# Patient Record
Sex: Female | Born: 1960 | Race: White | Hispanic: No | Smoking: Never smoker
Health system: Southern US, Community
[De-identification: ages and names within clinical notes are randomized; demographics above are authoritative.]

## PROBLEM LIST (undated history)

## (undated) DIAGNOSIS — F329 Major depressive disorder, single episode, unspecified: Secondary | ICD-10-CM

## (undated) DIAGNOSIS — E785 Hyperlipidemia, unspecified: Secondary | ICD-10-CM

## (undated) DIAGNOSIS — F32A Depression, unspecified: Secondary | ICD-10-CM

## (undated) DIAGNOSIS — M1990 Unspecified osteoarthritis, unspecified site: Secondary | ICD-10-CM

## (undated) DIAGNOSIS — I10 Essential (primary) hypertension: Secondary | ICD-10-CM

## (undated) DIAGNOSIS — M5126 Other intervertebral disc displacement, lumbar region: Secondary | ICD-10-CM

## (undated) HISTORY — PX: SKULL FRACTURE ELEVATION: SHX781

## (undated) HISTORY — PX: APPENDECTOMY: SHX54

## (undated) HISTORY — DX: Unspecified osteoarthritis, unspecified site: M19.90

## (undated) HISTORY — PX: SHOULDER SURGERY: SHX246

## (undated) HISTORY — PX: ANTERIOR CRUCIATE LIGAMENT REPAIR: SHX115

## (undated) HISTORY — DX: Major depressive disorder, single episode, unspecified: F32.9

## (undated) HISTORY — DX: Hyperlipidemia, unspecified: E78.5

## (undated) HISTORY — PX: ELBOW SURGERY: SHX618

## (undated) HISTORY — DX: Essential (primary) hypertension: I10

## (undated) HISTORY — PX: OTHER SURGICAL HISTORY: SHX169

## (undated) HISTORY — DX: Depression, unspecified: F32.A

## (undated) HISTORY — DX: Other intervertebral disc displacement, lumbar region: M51.26

---

## 2011-01-18 ENCOUNTER — Encounter (INDEPENDENT_AMBULATORY_CARE_PROVIDER_SITE_OTHER): Payer: Self-pay | Admitting: *Deleted

## 2011-01-28 NOTE — Letter (Signed)
Summary: Pre Visit Letter Revised  [facility]  [address] [place], [place] [address]   Phone: [phone]  Fax: [phone]        01/18/2011 MRN: [id number] Casey Drake [address] [place], [place]  [address]             Procedure Date:  03-01-11           Direct Colon---Dr. Russella Dar   Welcome to the Gastroenterology Division at [facility].    You are scheduled to see a nurse for your pre-procedure visit on 02-15-11 at 9:00a.m. on the 3rd floor at [facility], [address]  We ask that you try to arrive at our office 15 minutes prior to your appointment time to allow for check-in.  Please take a minute to review the attached form.  If you answer "Yes" to one or more of the questions on the first page, we ask that you call the person listed at your earliest opportunity.  If you answer "No" to all of the questions, please complete the rest of the form and bring it to your appointment.    Your nurse visit will consist of discussing your medical and surgical history, your immediate family medical history, and your medications.   If you are unable to list all of your medications on the form, please bring the medication bottles to your appointment and we will list them.  We will need to be aware of both prescribed and over the counter drugs.  We will need to know exact dosage information as well.    Please be prepared to read and sign documents such as consent forms, a financial agreement, and acknowledgement forms.  If necessary, and with your consent, a friend or relative is welcome to sit-in on the nurse visit with you.  Please bring your insurance card so that we may make a copy of it.  If your insurance requires a referral to see a specialist, please bring your referral form from your primary care physician.  No co-pay is required for this nurse visit.     If you cannot keep your appointment, please call [phone] to cancel or reschedule prior to  your appointment date.  This allows [place] the opportunity to schedule an appointment for another patient in need of care.    Thank you for choosing [facility] for your medical needs.  We appreciate the opportunity to care for you.  Please visit [place] at our website  to learn more about our practice.  Sincerely, The Gastroenterology Division

## 2011-03-01 ENCOUNTER — Other Ambulatory Visit: Payer: Self-pay | Admitting: Gastroenterology

## 2011-03-16 ENCOUNTER — Ambulatory Visit (AMBULATORY_SURGERY_CENTER): Payer: BC Managed Care – PPO

## 2011-03-16 VITALS — Ht 65.0 in | Wt 150.7 lb

## 2011-03-16 DIAGNOSIS — Z1211 Encounter for screening for malignant neoplasm of colon: Secondary | ICD-10-CM

## 2011-03-17 ENCOUNTER — Other Ambulatory Visit: Payer: Self-pay | Admitting: Gastroenterology

## 2011-03-17 ENCOUNTER — Encounter: Payer: Self-pay | Admitting: Gastroenterology

## 2011-03-17 MED ORDER — PEG-KCL-NACL-NASULF-NA ASC-C 100 G PO SOLR: ORAL | Status: DC

## 2011-03-17 NOTE — Telephone Encounter (Signed)
Notified patient that her moviprep was sent to [facility] pharmacy today,but if not there when she goes to call us back. She understands.

## 2011-03-22 ENCOUNTER — Encounter: Payer: Self-pay | Admitting: Gastroenterology

## 2011-03-23 ENCOUNTER — Ambulatory Visit (AMBULATORY_SURGERY_CENTER): Payer: BC Managed Care – PPO | Admitting: Gastroenterology

## 2011-03-23 ENCOUNTER — Encounter: Payer: Self-pay | Admitting: Gastroenterology

## 2011-03-23 VITALS — BP 124/80 | HR 72 | Temp 99.1°F | Resp 16 | Ht 65.0 in | Wt 144.5 lb

## 2011-03-23 DIAGNOSIS — K621 Rectal polyp: Secondary | ICD-10-CM

## 2011-03-23 DIAGNOSIS — Z1211 Encounter for screening for malignant neoplasm of colon: Secondary | ICD-10-CM

## 2011-03-23 DIAGNOSIS — D126 Benign neoplasm of colon, unspecified: Secondary | ICD-10-CM

## 2011-03-23 DIAGNOSIS — K5730 Diverticulosis of large intestine without perforation or abscess without bleeding: Secondary | ICD-10-CM

## 2011-03-23 DIAGNOSIS — K620 Anal polyp: Secondary | ICD-10-CM

## 2011-03-23 MED ORDER — SODIUM CHLORIDE 0.9 % IV SOLN: 500.0000 mL | INTRAVENOUS | Status: DC

## 2011-03-23 NOTE — Patient Instructions (Addendum)
Please refer to blue and green discharge instruction sheets. Polyps removed today. Diverticulosis found so eat a high fiber diet with liberal fluid intake as a permanent lifestyle, just not today. Today follow diet listed under light meal recommendations. Repeat colonoscopy in 3-5 years.

## 2011-03-24 ENCOUNTER — Telehealth: Payer: Self-pay

## 2011-03-24 ENCOUNTER — Ambulatory Visit: Payer: BC Managed Care – PPO | Attending: Physical Medicine and Rehabilitation | Admitting: Physical Therapy

## 2011-03-24 DIAGNOSIS — R5381 Other malaise: Secondary | ICD-10-CM | POA: Insufficient documentation

## 2011-03-24 DIAGNOSIS — IMO0001 Reserved for inherently not codable concepts without codable children: Secondary | ICD-10-CM | POA: Insufficient documentation

## 2011-03-24 DIAGNOSIS — M545 Low back pain, unspecified: Secondary | ICD-10-CM | POA: Insufficient documentation

## 2011-03-24 DIAGNOSIS — M2560 Stiffness of unspecified joint, not elsewhere classified: Secondary | ICD-10-CM | POA: Insufficient documentation

## 2011-03-24 NOTE — Telephone Encounter (Signed)

## 2011-03-31 ENCOUNTER — Encounter: Payer: Self-pay | Admitting: Gastroenterology

## 2013-01-18 ENCOUNTER — Telehealth: Payer: Self-pay | Admitting: Nurse Practitioner

## 2013-01-18 ENCOUNTER — Other Ambulatory Visit: Payer: Self-pay | Admitting: Nurse Practitioner

## 2013-01-18 MED ORDER — NORETHINDRONE-ETH ESTRADIOL 1-5 MG-MCG PO TABS: 1.0000 | ORAL_TABLET | Freq: Every day | ORAL | Status: DC

## 2013-01-18 NOTE — Telephone Encounter (Signed)
Please advise 

## 2013-01-18 NOTE — Telephone Encounter (Signed)
Birthcontrol  jinteli  [facility] [place] needs by  weekend

## 2013-01-25 ENCOUNTER — Telehealth: Payer: Self-pay | Admitting: Nurse Practitioner

## 2013-01-25 ENCOUNTER — Encounter: Payer: Self-pay | Admitting: General Practice

## 2013-01-25 ENCOUNTER — Ambulatory Visit (INDEPENDENT_AMBULATORY_CARE_PROVIDER_SITE_OTHER): Payer: BC Managed Care – PPO | Admitting: General Practice

## 2013-01-25 VITALS — BP 138/85 | HR 75 | Temp 97.2°F | Ht 65.0 in | Wt 163.0 lb

## 2013-01-25 DIAGNOSIS — J069 Acute upper respiratory infection, unspecified: Secondary | ICD-10-CM

## 2013-01-25 DIAGNOSIS — J329 Chronic sinusitis, unspecified: Secondary | ICD-10-CM

## 2013-01-25 MED ORDER — AMOXICILLIN 875 MG PO TABS: 875.0000 mg | ORAL_TABLET | Freq: Two times a day (BID) | ORAL | Status: AC

## 2013-01-25 NOTE — Patient Instructions (Signed)
Sinusitis Sinusitis is redness, soreness, and swelling (inflammation) of the paranasal sinuses. Paranasal sinuses are air pockets within the bones of your face (beneath the eyes, the middle of the forehead, or above the eyes). In healthy paranasal sinuses, mucus is able to drain out, and air is able to circulate through them by way of your nose. However, when your paranasal sinuses are inflamed, mucus and air can become trapped. This can allow bacteria and other germs to grow and cause infection. Sinusitis can develop quickly and last only a short time (acute) or continue over a long period (chronic). Sinusitis that lasts for more than 12 weeks is considered chronic.  CAUSES  Causes of sinusitis include:  Allergies.  Structural abnormalities, such as displacement of the cartilage that separates your nostrils (deviated septum), which can decrease the air flow through your nose and sinuses and affect sinus drainage.  Functional abnormalities, such as when the small hairs (cilia) that line your sinuses and help remove mucus do not work properly or are not present. SYMPTOMS  Symptoms of acute and chronic sinusitis are the same. The primary symptoms are pain and pressure around the affected sinuses. Other symptoms include:  Upper toothache.  Earache.  Headache.  Bad breath.  Decreased sense of smell and taste.  A cough, which worsens when you are lying flat.  Fatigue.  Fever.  Thick drainage from your nose, which often is green and may contain pus (purulent).  Swelling and warmth over the affected sinuses. DIAGNOSIS  Your caregiver will perform a physical exam. During the exam, your caregiver may:  Look in your nose for signs of abnormal growths in your nostrils (nasal polyps).  Tap over the affected sinus to check for signs of infection.  View the inside of your sinuses (endoscopy) with a special imaging device with a light attached (endoscope), which is inserted into your  sinuses. If your caregiver suspects that you have chronic sinusitis, one or more of the following tests may be recommended:  Allergy tests.  Nasal culture A sample of mucus is taken from your nose and sent to a lab and screened for bacteria.  Nasal cytology A sample of mucus is taken from your nose and examined by your caregiver to determine if your sinusitis is related to an allergy. TREATMENT  Most cases of acute sinusitis are related to a viral infection and will resolve on their own within 10 days. Sometimes medicines are prescribed to help relieve symptoms (pain medicine, decongestants, nasal steroid sprays, or saline sprays).  However, for sinusitis related to a bacterial infection, your caregiver will prescribe antibiotic medicines. These are medicines that will help kill the bacteria causing the infection.  Rarely, sinusitis is caused by a fungal infection. In theses cases, your caregiver will prescribe antifungal medicine. For some cases of chronic sinusitis, surgery is needed. Generally, these are cases in which sinusitis recurs more than 3 times per year, despite other treatments. HOME CARE INSTRUCTIONS   Drink plenty of water. Water helps thin the mucus so your sinuses can drain more easily.  Use a humidifier.  Inhale steam 3 to 4 times a day (for example, sit in the bathroom with the shower running).  Apply a warm, moist washcloth to your face 3 to 4 times a day, or as directed by your caregiver.  Use saline nasal sprays to help moisten and clean your sinuses.  Take over-the-counter or prescription medicines for pain, discomfort, or fever only as directed by your caregiver. SEEK IMMEDIATE MEDICAL   or as directed by your caregiver.   Use saline nasal sprays to help moisten and clean your sinuses.   Take over-the-counter or prescription medicines for pain, discomfort, or fever only as directed by your caregiver.  SEEK IMMEDIATE MEDICAL CARE IF:   You have increasing pain or severe headaches.   You have nausea, vomiting, or drowsiness.   You have swelling around your face.   You have vision problems.   You have a stiff neck.   You have difficulty breathing.  MAKE SURE YOU:    Understand these  instructions.   Will watch your condition.   Will get help right away if you are not doing well or get worse.  Document Released: 10/18/2005 Document Revised: 01/10/2012 Document Reviewed: 11/02/2011  ExitCare Patient Information 2013 [employer].    Upper Respiratory Infection, Adult  An upper respiratory infection (URI) is also sometimes known as the common cold. The upper respiratory tract includes the nose, sinuses, throat, trachea, and bronchi. Bronchi are the airways leading to the lungs. Most people improve within 1 week, but symptoms can last up to 2 weeks. A residual cough may last even longer.   CAUSES  Many different viruses can infect the tissues lining the upper respiratory tract. The tissues become irritated and inflamed and often become very moist. Mucus production is also common. A cold is contagious. You can easily spread the virus to others by oral contact. This includes kissing, sharing a glass, coughing, or sneezing. Touching your mouth or nose and then touching a surface, which is then touched by another person, can also spread the virus.  SYMPTOMS   Symptoms typically develop 1 to 3 days after you come in contact with a cold virus. Symptoms vary from person to person. They may include:   Runny nose.   Sneezing.   Nasal congestion.   Sinus irritation.   Sore throat.   Loss of voice (laryngitis).   Cough.   Fatigue.   Muscle aches.   Loss of appetite.   Headache.   Low-grade fever.  DIAGNOSIS   You might diagnose your own cold based on familiar symptoms, since most people get a cold 2 to 3 times a year. Your caregiver can confirm this based on your exam. Most importantly, your caregiver can check that your symptoms are not due to another disease such as strep throat, sinusitis, pneumonia, asthma, or epiglottitis. Blood tests, throat tests, and X-rays are not necessary to diagnose a common cold, but they may sometimes be helpful in excluding other more serious diseases. Your  caregiver will decide if any further tests are required.  RISKS AND COMPLICATIONS   You may be at risk for a more severe case of the common cold if you smoke cigarettes, have chronic heart disease (such as heart failure) or lung disease (such as asthma), or if you have a weakened immune system. The very young and very old are also at risk for more serious infections. Bacterial sinusitis, middle ear infections, and bacterial pneumonia can complicate the common cold. The common cold can worsen asthma and chronic obstructive pulmonary disease (COPD). Sometimes, these complications can require emergency medical care and may be life-threatening.  PREVENTION   The best way to protect against getting a cold is to practice good hygiene. Avoid oral or hand contact with people with cold symptoms. Wash your hands often if contact occurs. There is no clear evidence that vitamin C, vitamin E, echinacea, or exercise reduces the chance of developing a   cold. However, it is always recommended to get plenty of rest and practice good nutrition.  TREATMENT   Treatment is directed at relieving symptoms. There is no cure. Antibiotics are not effective, because the infection is caused by a virus, not by bacteria. Treatment may include:   Increased fluid intake. Sports drinks offer valuable electrolytes, sugars, and fluids.   Breathing heated mist or steam (vaporizer or shower).   Eating chicken soup or other clear broths, and maintaining good nutrition.   Getting plenty of rest.   Using gargles or lozenges for comfort.   Controlling fevers with ibuprofen or acetaminophen as directed by your caregiver.   Increasing usage of your inhaler if you have asthma.  Zinc gel and zinc lozenges, taken in the first 24 hours of the common cold, can shorten the duration and lessen the severity of symptoms. Pain medicines may help with fever, muscle aches, and throat pain. A variety of non-prescription medicines are available to treat congestion  and runny nose. Your caregiver can make recommendations and may suggest nasal or lung inhalers for other symptoms.   HOME CARE INSTRUCTIONS    Only take over-the-counter or prescription medicines for pain, discomfort, or fever as directed by your caregiver.   Use a warm mist humidifier or inhale steam from a shower to increase air moisture. This may keep secretions moist and make it easier to breathe.   Drink enough water and fluids to keep your urine clear or pale yellow.   Rest as needed.   Return to work when your temperature has returned to normal or as your caregiver advises. You may need to stay home longer to avoid infecting others. You can also use a face mask and careful hand washing to prevent spread of the virus.  SEEK MEDICAL CARE IF:    After the first few days, you feel you are getting worse rather than better.   You need your caregiver's advice about medicines to control symptoms.   You develop chills, worsening shortness of breath, or brown or red sputum. These may be signs of pneumonia.   You develop yellow or brown nasal discharge or pain in the face, especially when you bend forward. These may be signs of sinusitis.   You develop a fever, swollen neck glands, pain with swallowing, or white areas in the back of your throat. These may be signs of strep throat.  SEEK IMMEDIATE MEDICAL CARE IF:    You have a fever.   You develop severe or persistent headache, ear pain, sinus pain, or chest pain.   You develop wheezing, a prolonged cough, cough up blood, or have a change in your usual mucus (if you have chronic lung disease).   You develop sore muscles or a stiff neck.  Document Released: 04/13/2001 Document Revised: 01/10/2012 Document Reviewed: 02/19/2011  ExitCare Patient Information 2013 [employer].

## 2013-01-25 NOTE — Progress Notes (Signed)
  Subjective:    Patient ID: Casey Drake, female    DOB: 09/09/1961, 52 y.o.   MRN: [id number]  HPI Presents today with nasal congestion and cough for past 3-4 days. Rapidly worsened overnight. OTC mucinex and  nyquil taken without relief. Reports feeling like she had a fever, but temperature not checked. Reports symptoms worse at night.    Review of Systems  Constitutional: Positive for fever and chills.  HENT: Positive for sore throat and sinus pressure.   Eyes: Negative for pain, discharge and itching.  Respiratory: Positive for cough. Negative for chest tightness and shortness of breath.   Cardiovascular: Negative for chest pain.  Neurological: Negative for dizziness and headaches.  Psychiatric/Behavioral: Negative.        Objective:   Physical Exam  Constitutional: She is oriented to person, place, and time. She appears well-developed and well-nourished.  HENT:  Head: Normocephalic and atraumatic.  Right Ear: External ear normal.  Left Ear: External ear normal.  Nose: Right sinus exhibits maxillary sinus tenderness and frontal sinus tenderness. Left sinus exhibits maxillary sinus tenderness and frontal sinus tenderness.  Neck: Normal range of motion.  Cardiovascular: Normal rate, regular rhythm and normal heart sounds.   No murmur heard. Pulmonary/Chest: Effort normal and breath sounds normal. No respiratory distress. She exhibits no tenderness.  Neurological: She is alert and oriented to person, place, and time.  Skin: Skin is warm and dry. No rash noted.  Psychiatric: She has a normal mood and affect.          Assessment & Plan:  Complete full antibiotic course even if feeling better Increase fluid intake Proper handwashing RTO if not feeling better in 3 days and doesn't completely resolve  Patient verbalized understanding   Raymon Mutton, FNP-C

## 2013-01-25 NOTE — Telephone Encounter (Signed)
appt made for today 

## 2013-01-25 NOTE — Telephone Encounter (Signed)
Wtbs. Bad cold, sinus infection, coughing up yellow stuff, lost her voice.

## 2013-02-03 ENCOUNTER — Telehealth: Payer: Self-pay | Admitting: Nurse Practitioner

## 2013-02-03 NOTE — Telephone Encounter (Signed)
The return to work day is February 05, 2013, because she is still sick. Thank you

## 2013-02-05 NOTE — Telephone Encounter (Signed)
done

## 2013-03-28 ENCOUNTER — Other Ambulatory Visit: Payer: Self-pay | Admitting: Nurse Practitioner

## 2013-03-30 NOTE — Telephone Encounter (Signed)
Don't see last refill on epic or in chart? [facility]

## 2013-03-30 NOTE — Telephone Encounter (Signed)
Please call in rx for xanax 

## 2013-04-02 ENCOUNTER — Telehealth: Payer: Self-pay | Admitting: Nurse Practitioner

## 2013-04-02 NOTE — Telephone Encounter (Signed)
rx called in

## 2013-04-02 NOTE — Telephone Encounter (Signed)
No appts available for 6/3.  Appt scheduled for 04/04/13.  Pt will check back to see if there is a cancellation for tomorrow.  Also offered appt with another provider.  She will call to reschedule if she decides she can't come 04/04/13.

## 2013-04-04 ENCOUNTER — Encounter: Payer: Self-pay | Admitting: Nurse Practitioner

## 2013-04-04 ENCOUNTER — Ambulatory Visit (INDEPENDENT_AMBULATORY_CARE_PROVIDER_SITE_OTHER): Payer: BC Managed Care – PPO | Admitting: Nurse Practitioner

## 2013-04-04 VITALS — BP 137/85 | HR 68 | Temp 97.2°F | Ht 64.0 in | Wt 159.0 lb

## 2013-04-04 DIAGNOSIS — F329 Major depressive disorder, single episode, unspecified: Secondary | ICD-10-CM | POA: Insufficient documentation

## 2013-04-04 DIAGNOSIS — G8929 Other chronic pain: Secondary | ICD-10-CM

## 2013-04-04 DIAGNOSIS — M545 Low back pain, unspecified: Secondary | ICD-10-CM

## 2013-04-04 DIAGNOSIS — F32A Depression, unspecified: Secondary | ICD-10-CM | POA: Insufficient documentation

## 2013-04-04 DIAGNOSIS — F411 Generalized anxiety disorder: Secondary | ICD-10-CM | POA: Insufficient documentation

## 2013-04-04 DIAGNOSIS — E785 Hyperlipidemia, unspecified: Secondary | ICD-10-CM | POA: Insufficient documentation

## 2013-04-04 MED ORDER — METHYLPREDNISOLONE ACETATE 80 MG/ML IJ SUSP
80.0000 mg | Freq: Once | INTRAMUSCULAR | Status: AC
  Administered 2013-04-04: 80 mg via INTRAMUSCULAR

## 2013-04-04 NOTE — Patient Instructions (Signed)
Back Pain, Adult  Low back pain is very common. About 1 in 5 people have back pain. The cause of low back pain is rarely dangerous. The pain often gets better over time. About half of people with a sudden onset of back pain feel better in just 2 weeks. About 8 in 10 people feel better by 6 weeks.   CAUSES  Some common causes of back pain include:  · Strain of the muscles or ligaments supporting the spine.  · Wear and tear (degeneration) of the spinal discs.  · Arthritis.  · Direct injury to the back.  DIAGNOSIS  Most of the time, the direct cause of low back pain is not known. However, back pain can be treated effectively even when the exact cause of the pain is unknown. Answering your caregiver's questions about your overall health and symptoms is one of the most accurate ways to make sure the cause of your pain is not dangerous. If your caregiver needs more information, he or she may order lab work or imaging tests (X-rays or MRIs). However, even if imaging tests show changes in your back, this usually does not require surgery.  HOME CARE INSTRUCTIONS  For many people, back pain returns. Since low back pain is rarely dangerous, it is often a condition that people can learn to manage on their own.   · Remain active. It is stressful on the back to sit or stand in one place. Do not sit, drive, or stand in one place for more than 30 minutes at a time. Take short walks on level surfaces as soon as pain allows. Try to increase the length of time you walk each day.  · Do not stay in bed. Resting more than 1 or 2 days can delay your recovery.  · Do not avoid exercise or work. Your body is made to move. It is not dangerous to be active, even though your back may hurt. Your back will likely heal faster if you return to being active before your pain is gone.  · Pay attention to your body when you  bend and lift. Many people have less discomfort when lifting if they bend their knees, keep the load close to their bodies, and  avoid twisting. Often, the most comfortable positions are those that put less stress on your recovering back.  · Find a comfortable position to sleep. Use a firm mattress and lie on your side with your knees slightly bent. If you lie on your back, put a pillow under your knees.  · Only take over-the-counter or prescription medicines as directed by your caregiver. Over-the-counter medicines to reduce pain and inflammation are often the most helpful. Your caregiver may prescribe muscle relaxant drugs. These medicines help dull your pain so you can more quickly return to your normal activities and healthy exercise.  · Put ice on the injured area.  · Put ice in a plastic bag.  · Place a towel between your skin and the bag.  · Leave the ice on for 15-20 minutes, 3-4 times a day for the first 2 to 3 days. After that, ice and heat may be alternated to reduce pain and spasms.  · Ask your caregiver about trying back exercises and gentle massage. This may be of some benefit.  · Avoid feeling anxious or stressed. Stress increases muscle tension and can worsen back pain. It is important to recognize when you are anxious or stressed and learn ways to manage it. Exercise is a great option.  SEEK MEDICAL CARE IF:  · You have pain that is not relieved with rest or   medicine.  · You have pain that does not improve in 1 week.  · You have new symptoms.  · You are generally not feeling well.  SEEK IMMEDIATE MEDICAL CARE IF:   · You have pain that radiates from your back into your legs.  · You develop new bowel or bladder control problems.  · You have unusual weakness or numbness in your arms or legs.  · You develop nausea or vomiting.  · You develop abdominal pain.  · You feel faint.  Document Released: 10/18/2005 Document Revised: 04/18/2012 Document Reviewed: 03/08/2011  ExitCare® Patient Information ©2014 [employer].

## 2013-04-04 NOTE — Progress Notes (Signed)
  Subjective:    Patient ID: Casey Drake, female    DOB: 24-Feb-1961, 52 y.o.   MRN: [id number]  Back Pain This is a recurrent problem. The current episode started more than 1 year ago. The problem occurs 2 to 4 times per day. The problem has been waxing and waning since onset. The pain is present in the lumbar spine. The quality of the pain is described as stabbing (pulling pain). The pain radiates to the right foot. The pain is at a severity of 7/10. The pain is moderate. The pain is worse during the day. The symptoms are aggravated by bending, position, standing, sitting and twisting. Stiffness is present in the morning. Associated symptoms include leg pain (right). Pertinent negatives include no numbness, tingling or weakness. Treatments tried: pain meds. The treatment provided moderate relief.      Review of Systems  Musculoskeletal: Positive for back pain.  Neurological: Negative for tingling, weakness and numbness.  All other systems reviewed and are negative.       Objective:   Physical Exam  Constitutional: She appears well-developed and well-nourished.  Cardiovascular: Normal rate, normal heart sounds and intact distal pulses.   Musculoskeletal:  Decreased ROM due to pain on flexion and extension Sensation distally intact  Neurological: She has normal reflexes.  Skin: Skin is warm.  Psychiatric: She has a normal mood and affect. Her behavior is normal. Judgment and thought content normal.     BP 137/85  Pulse 68  Temp(Src) 97.2 F (36.2 C) (Oral)  Ht 5\' 4"  (1.626 m)  Wt 159 lb (72.122 kg)  BMI 27.28 kg/m2      Assessment & Plan:   1. Chronic low back pain   Depomedrol 80 mg Im now Moist heat Rest Follow up prn  [name] Daphine Deutscher, FNP

## 2013-05-08 ENCOUNTER — Other Ambulatory Visit: Payer: Self-pay | Admitting: Nurse Practitioner

## 2013-05-10 ENCOUNTER — Telehealth: Payer: Self-pay | Admitting: Nurse Practitioner

## 2013-05-10 NOTE — Telephone Encounter (Signed)
Last seen 04/04/13 

## 2013-05-11 NOTE — Telephone Encounter (Signed)
All done on 05/08/13

## 2013-08-06 ENCOUNTER — Ambulatory Visit (INDEPENDENT_AMBULATORY_CARE_PROVIDER_SITE_OTHER): Payer: BC Managed Care – PPO | Admitting: Nurse Practitioner

## 2013-08-06 ENCOUNTER — Encounter: Payer: Self-pay | Admitting: Nurse Practitioner

## 2013-08-06 ENCOUNTER — Telehealth: Payer: Self-pay | Admitting: Nurse Practitioner

## 2013-08-06 VITALS — BP 136/78 | HR 73 | Temp 97.9°F | Ht 64.0 in | Wt 171.0 lb

## 2013-08-06 DIAGNOSIS — Z23 Encounter for immunization: Secondary | ICD-10-CM

## 2013-08-06 DIAGNOSIS — M25559 Pain in unspecified hip: Secondary | ICD-10-CM

## 2013-08-06 DIAGNOSIS — M25551 Pain in right hip: Secondary | ICD-10-CM

## 2013-08-06 MED ORDER — CITALOPRAM HYDROBROMIDE 20 MG PO TABS: 20.0000 mg | ORAL_TABLET | Freq: Every day | ORAL | Status: DC

## 2013-08-06 MED ORDER — TRAZODONE HCL 50 MG PO TABS: 50.0000 mg | ORAL_TABLET | Freq: Every day | ORAL | Status: DC

## 2013-08-06 MED ORDER — METHYLPREDNISOLONE ACETATE 80 MG/ML IJ SUSP: 80.0000 mg | Freq: Once | INTRAMUSCULAR | Status: DC

## 2013-08-06 MED ORDER — METOPROLOL SUCCINATE ER 25 MG PO TB24: 25.0000 mg | ORAL_TABLET | Freq: Every day | ORAL | Status: DC

## 2013-08-06 MED ORDER — METHYLPREDNISOLONE ACETATE 80 MG/ML IJ SUSP
80.0000 mg | Freq: Once | INTRAMUSCULAR | Status: AC
  Administered 2013-08-06: 80 mg via INTRAMUSCULAR

## 2013-08-06 MED ORDER — ALPRAZOLAM 1 MG PO TABS: 1.0000 mg | ORAL_TABLET | Freq: Every evening | ORAL | Status: DC | PRN

## 2013-08-06 NOTE — Telephone Encounter (Signed)
Patient came in and was worked into the schedule.

## 2013-08-06 NOTE — Patient Instructions (Signed)

## 2013-08-06 NOTE — Telephone Encounter (Signed)
Spoke with patient but she lost cell phone coverage. Will attempt to call again later today.

## 2013-08-06 NOTE — Progress Notes (Signed)
  Subjective:    Patient ID: Casey Drake, female    DOB: 10/24/1961, 52 y.o.   MRN: [id number]  HPI Patient in today C/O right hip pain- Flares up a couple of times a year- she usually gets a steroid shot and that helps for several months- Sees Dr. Ethelene Hal for steroid injections into back- Current pain is in right leg and radiates down front of leg to ankle- sitting increases pain- walking or standing helps pain.    Review of Systems  All other systems reviewed and are negative.       Objective:   Physical Exam  Constitutional: She is oriented to person, place, and time. She appears well-developed and well-nourished.  Cardiovascular: Normal rate, regular rhythm and normal heart sounds.   Pulmonary/Chest: Effort normal and breath sounds normal.  Musculoskeletal: Normal range of motion.  Negative SLR bil Pain Rt hip with full extension and abduction  Neurological: She is alert and oriented to person, place, and time. She has normal reflexes.    BP 136/78  Pulse 73  Temp(Src) 97.9 F (36.6 C) (Oral)  Ht 5\' 4"  (1.626 m)  Wt 171 lb (77.565 kg)  BMI 29.34 kg/m2       Assessment & Plan:   1. Right hip pain    Meds ordered this encounter  Medications  . ALPRAZolam (XANAX) 1 MG tablet    Sig: Take 1 tablet (1 mg total) by mouth at bedtime as needed for sleep.    Dispense:  30 tablet    Refill:  1  . citalopram (CELEXA) 20 MG tablet    Sig: Take 1 tablet (20 mg total) by mouth daily.    Dispense:  90 tablet    Refill:  1  . metoprolol succinate (TOPROL-XL) 25 MG 24 hr tablet    Sig: Take 1 tablet (25 mg total) by mouth daily.    Dispense:  30 tablet    Refill:  5  . traZODone (DESYREL) 50 MG tablet    Sig: Take 1 tablet (50 mg total) by mouth at bedtime.    Dispense:  180 tablet    Refill:  1  . DISCONTD: methylPREDNISolone acetate (DEPO-MEDROL) injection 80 mg    Sig:   . methylPREDNISolone acetate (DEPO-MEDROL) injection 80 mg    Sig:    Warm compresses Rest   RTO prn  [name] Daphine Deutscher, FNP

## 2013-08-08 ENCOUNTER — Other Ambulatory Visit: Payer: Self-pay | Admitting: Nurse Practitioner

## 2013-08-09 NOTE — Telephone Encounter (Signed)
Last seen 08/06/13  MMM  If approved route to [facility] to [facility]

## 2013-11-14 ENCOUNTER — Telehealth: Payer: Self-pay | Admitting: Nurse Practitioner

## 2013-11-14 NOTE — Telephone Encounter (Signed)
Appt tomorrow with Ronnald Collum

## 2013-11-15 ENCOUNTER — Ambulatory Visit (INDEPENDENT_AMBULATORY_CARE_PROVIDER_SITE_OTHER): Payer: BC Managed Care – PPO | Admitting: Nurse Practitioner

## 2013-11-15 ENCOUNTER — Telehealth: Payer: Self-pay | Admitting: Nurse Practitioner

## 2013-11-15 ENCOUNTER — Encounter: Payer: Self-pay | Admitting: Nurse Practitioner

## 2013-11-15 VITALS — BP 135/85 | HR 64 | Temp 97.0°F | Ht 64.0 in | Wt 167.0 lb

## 2013-11-15 DIAGNOSIS — M25551 Pain in right hip: Secondary | ICD-10-CM | POA: Insufficient documentation

## 2013-11-15 DIAGNOSIS — M5431 Sciatica, right side: Secondary | ICD-10-CM

## 2013-11-15 DIAGNOSIS — M25559 Pain in unspecified hip: Secondary | ICD-10-CM

## 2013-11-15 DIAGNOSIS — M5430 Sciatica, unspecified side: Secondary | ICD-10-CM

## 2013-11-15 MED ORDER — KETOROLAC TROMETHAMINE 60 MG/2ML IM SOLN
60.0000 mg | Freq: Once | INTRAMUSCULAR | Status: AC
  Administered 2013-11-15: 60 mg via INTRAMUSCULAR

## 2013-11-15 NOTE — Patient Instructions (Signed)
Sciatica °Sciatica is pain, weakness, numbness, or tingling along the path of the sciatic nerve. The nerve starts in the lower back and runs down the back of each leg. The nerve controls the muscles in the lower leg and in the back of the knee, while also providing sensation to the back of the thigh, lower leg, and the sole of your foot. Sciatica is a symptom of another medical condition. For instance, nerve damage or certain conditions, such as a herniated disk or bone spur on the spine, pinch or put pressure on the sciatic nerve. This causes the pain, weakness, or other sensations normally associated with sciatica. Generally, sciatica only affects one side of the body. °CAUSES  °· Herniated or slipped disc. °· Degenerative disk disease. °· A pain disorder involving the narrow muscle in the buttocks (piriformis syndrome). °· Pelvic injury or fracture. °· Pregnancy. °· Tumor (rare). °SYMPTOMS  °Symptoms can vary from mild to very severe. The symptoms usually travel from the low back to the buttocks and down the back of the leg. Symptoms can include: °· Mild tingling or dull aches in the lower back, leg, or hip. °· Numbness in the back of the calf or sole of the foot. °· Burning sensations in the lower back, leg, or hip. °· Sharp pains in the lower back, leg, or hip. °· Leg weakness. °· Severe back pain inhibiting movement. °These symptoms may get worse with coughing, sneezing, laughing, or prolonged sitting or standing. Also, being overweight may worsen symptoms. °DIAGNOSIS  °Your caregiver will perform a physical exam to look for common symptoms of sciatica. He or she may ask you to do certain movements or activities that would trigger sciatic nerve pain. Other tests may be performed to find the cause of the sciatica. These may include: °· Blood tests. °· X-rays. °· Imaging tests, such as an MRI or CT scan. °TREATMENT  °Treatment is directed at the cause of the sciatic pain. Sometimes, treatment is not necessary  and the pain and discomfort goes away on its own. If treatment is needed, your caregiver may suggest: °· Over-the-counter medicines to relieve pain. °· Prescription medicines, such as anti-inflammatory medicine, muscle relaxants, or narcotics. °· Applying heat or ice to the painful area. °· Steroid injections to lessen pain, irritation, and inflammation around the nerve. °· Reducing activity during periods of pain. °· Exercising and stretching to strengthen your abdomen and improve flexibility of your spine. Your caregiver may suggest losing weight if the extra weight makes the back pain worse. °· Physical therapy. °· Surgery to eliminate what is pressing or pinching the nerve, such as a bone spur or part of a herniated disk. °HOME CARE INSTRUCTIONS  °· Only take over-the-counter or prescription medicines for pain or discomfort as directed by your caregiver. °· Apply ice to the affected area for 20 minutes, 3 4 times a day for the first 48 72 hours. Then try heat in the same way. °· Exercise, stretch, or perform your usual activities if these do not aggravate your pain. °· Attend physical therapy sessions as directed by your caregiver. °· Keep all follow-up appointments as directed by your caregiver. °· Do not wear high heels or shoes that do not provide proper support. °· Check your mattress to see if it is too soft. A firm mattress may lessen your pain and discomfort. °SEEK IMMEDIATE MEDICAL CARE IF:  °· You lose control of your bowel or bladder (incontinence). °· You have increasing weakness in the lower back,   pelvis, buttocks, or legs. °· You have redness or swelling of your back. °· You have a burning sensation when you urinate. °· You have pain that gets worse when you lie down or awakens you at night. °· Your pain is worse than you have experienced in the past. °· Your pain is lasting longer than 4 weeks. °· You are suddenly losing weight without reason. °MAKE SURE YOU: °· Understand these  instructions. °· Will watch your condition. °· Will get help right away if you are not doing well or get worse. °Document Released: 10/12/2001 Document Revised: 04/18/2012 Document Reviewed: 02/27/2012 °ExitCare® Patient Information ©2014 [employer]. ° °

## 2013-11-15 NOTE — Telephone Encounter (Signed)
Patient called to let us know correct dose of metoprolol

## 2013-11-15 NOTE — Progress Notes (Signed)
   Subjective:    Patient ID: Casey Drake, female    DOB: [birth year], 53 y.o.   MRN: [id number]  HPI Patient has history of intermittent sciatica- she has been having to sit for extended periods of time for work and that has aggravated more.    Review of Systems  Constitutional: Negative.   HENT: Negative.   Respiratory: Negative.   Cardiovascular: Negative.   Gastrointestinal: Negative.   All other systems reviewed and are negative.       Objective:   Physical Exam  Constitutional: She appears well-developed and well-nourished.  Cardiovascular: Normal rate, regular rhythm and normal heart sounds.   Pulmonary/Chest: Effort normal and breath sounds normal.  Musculoskeletal:  Pain right hip and buttocks- radiates down back of right leg Negative straight leg raises bil   BP 135/85  Pulse 64  Temp(Src) 97 F (36.1 C) (Oral)  Ht 5\' 4"  (1.626 m)  Wt 167 lb (75.751 kg)  BMI 28.65 kg/m2        Assessment & Plan:   1. Right hip pain   2. Right sided sciatica    Meds ordered this encounter  Medications  . ketorolac (TORADOL) injection 60 mg    Sig:    Moist heat/heating pad Stretching exercises Up and walk around at least every 2 hours  [name] Hassell Done, FNP

## 2013-12-24 ENCOUNTER — Telehealth: Payer: Self-pay | Admitting: Nurse Practitioner

## 2013-12-24 ENCOUNTER — Other Ambulatory Visit: Payer: Self-pay | Admitting: Family Medicine

## 2013-12-24 ENCOUNTER — Ambulatory Visit (INDEPENDENT_AMBULATORY_CARE_PROVIDER_SITE_OTHER): Payer: BC Managed Care – PPO | Admitting: Family Medicine

## 2013-12-24 ENCOUNTER — Encounter: Payer: Self-pay | Admitting: Family Medicine

## 2013-12-24 VITALS — BP 153/88 | HR 67 | Temp 97.1°F | Ht 64.0 in | Wt 167.0 lb

## 2013-12-24 DIAGNOSIS — R05 Cough: Secondary | ICD-10-CM

## 2013-12-24 DIAGNOSIS — J069 Acute upper respiratory infection, unspecified: Secondary | ICD-10-CM

## 2013-12-24 DIAGNOSIS — R6889 Other general symptoms and signs: Secondary | ICD-10-CM

## 2013-12-24 DIAGNOSIS — R059 Cough, unspecified: Secondary | ICD-10-CM

## 2013-12-24 LAB — POCT INFLUENZA A/B
INFLUENZA A, POC: NEGATIVE
Influenza B, POC: NEGATIVE

## 2013-12-24 MED ORDER — AZITHROMYCIN 250 MG PO TABS: ORAL_TABLET | ORAL | Status: DC

## 2013-12-24 NOTE — Progress Notes (Signed)
   Subjective:    Patient ID: Casey Drake, female    DOB: Sep 12, 1961, 53 y.o.   MRN: [id number]  HPI URI Symptoms Onset: 5-6 days  Description: rhinorrhea, nasal congestion, cough, fever, body aches, chills  Modifying factors:  None   Symptoms Nasal discharge: yes Fever: yes Sore throat: no Cough: yes Wheezing: no Ear pain: no GI symptoms: no Sick contacts: yes  Red Flags  Stiff neck: no Dyspnea: no Rash: no Swallowing difficulty: no  Sinusitis Risk Factors Headache/face pain: no Double sickening: no tooth pain: no  Allergy Risk Factors Sneezing: no Itchy scratchy throat: no Seasonal symptoms: no  Flu Risk Factors Headache: yes muscle aches: yes severe fatigue: yes     Review of Systems  All other systems reviewed and are negative.       Objective:   Physical Exam  Constitutional: She is oriented to person, place, and time. She appears well-developed and well-nourished.  HENT:  Head: Normocephalic and atraumatic.  Right Ear: External ear normal.  Left Ear: External ear normal.  +nasal erythema, rhinorrhea bilaterally, + post oropharyngeal erythema    Eyes: Conjunctivae are normal. Pupils are equal, round, and reactive to light.  Neck: Normal range of motion. Neck supple.  Cardiovascular: Normal rate and regular rhythm.   Pulmonary/Chest: Effort normal and breath sounds normal.  Abdominal: Soft.  Musculoskeletal: Normal range of motion.  Neurological: She is alert and oriented to person, place, and time.  Skin: Skin is warm.          Assessment & Plan:  Cough - Plan: POCT Influenza A/B  URI (upper respiratory infection)  Flu-like symptoms  Likely flu like illness Outside of treatment window with tamiflu.  Reassuring resp exam today.  PPx rx for zpak if sxs fail to improve/worsen over the next week (pt lives remotely in [place]).  Discussed supportive care and infectious/resp red flags.  Follow up as needed.

## 2013-12-24 NOTE — Telephone Encounter (Signed)
appt made

## 2013-12-26 ENCOUNTER — Telehealth: Payer: Self-pay | Admitting: *Deleted

## 2013-12-26 NOTE — Telephone Encounter (Signed)
Continues to feel very bad. Would like work note until tomorrow. She was scheduled to go back today. Told her that I didn't think that would be a problem. She doesn't need a work not she just needs this documented on her [place].

## 2013-12-27 ENCOUNTER — Other Ambulatory Visit: Payer: Self-pay | Admitting: Nurse Practitioner

## 2013-12-27 MED ORDER — OSELTAMIVIR PHOSPHATE 75 MG PO CAPS
75.0000 mg | ORAL_CAPSULE | Freq: Two times a day (BID) | ORAL | Status: DC
Start: 2013-12-27 — End: 2014-01-29

## 2014-01-02 ENCOUNTER — Other Ambulatory Visit: Payer: Self-pay

## 2014-01-02 MED ORDER — NORETHINDRONE-ETH ESTRADIOL 1-5 MG-MCG PO TABS: 1.0000 | ORAL_TABLET | Freq: Every day | ORAL | Status: DC

## 2014-01-02 NOTE — Telephone Encounter (Signed)
ntbs for PAP

## 2014-01-02 NOTE — Telephone Encounter (Signed)
Seen 11/15/13  MMM  I see no PAP in EPIC

## 2014-01-03 ENCOUNTER — Other Ambulatory Visit: Payer: Self-pay | Admitting: Nurse Practitioner

## 2014-01-22 ENCOUNTER — Encounter: Payer: Self-pay | Admitting: Gastroenterology

## 2014-01-28 ENCOUNTER — Telehealth: Payer: Self-pay | Admitting: Nurse Practitioner

## 2014-01-28 NOTE — Telephone Encounter (Signed)
Appt given for tomorrow with Ernestina Patches

## 2014-01-29 ENCOUNTER — Ambulatory Visit (INDEPENDENT_AMBULATORY_CARE_PROVIDER_SITE_OTHER): Payer: BC Managed Care – PPO | Admitting: Family Medicine

## 2014-01-29 ENCOUNTER — Encounter: Payer: Self-pay | Admitting: Family Medicine

## 2014-01-29 VITALS — BP 135/84 | HR 66 | Temp 98.4°F | Ht 64.0 in | Wt 169.0 lb

## 2014-01-29 DIAGNOSIS — S39012A Strain of muscle, fascia and tendon of lower back, initial encounter: Secondary | ICD-10-CM

## 2014-01-29 DIAGNOSIS — M5430 Sciatica, unspecified side: Secondary | ICD-10-CM

## 2014-01-29 DIAGNOSIS — S335XXA Sprain of ligaments of lumbar spine, initial encounter: Secondary | ICD-10-CM

## 2014-01-29 MED ORDER — SODIUM CHLORIDE 0.9 % IV SOLN: 125.0000 mg | Freq: Once | INTRAVENOUS | Status: DC

## 2014-01-29 MED ORDER — PREDNISONE 50 MG PO TABS: ORAL_TABLET | ORAL | Status: DC

## 2014-01-29 MED ORDER — CYCLOBENZAPRINE HCL 10 MG PO TABS: 10.0000 mg | ORAL_TABLET | Freq: Three times a day (TID) | ORAL | Status: DC | PRN

## 2014-01-29 MED ORDER — METHYLPREDNISOLONE SODIUM SUCC 125 MG IJ SOLR
125.0000 mg | Freq: Once | INTRAMUSCULAR | Status: AC
  Administered 2014-01-29: 125 mg via INTRAMUSCULAR
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
## 2014-01-29 NOTE — Addendum Note (Signed)
Addended by: Ilean China on: 01/29/2014 12:41 PM   Modules accepted: Orders

## 2014-02-15 ENCOUNTER — Other Ambulatory Visit: Payer: Self-pay | Admitting: *Deleted

## 2014-02-15 MED ORDER — METOPROLOL SUCCINATE ER 50 MG PO TB24: 50.0000 mg | ORAL_TABLET | Freq: Every day | ORAL | Status: DC

## 2014-03-05 ENCOUNTER — Telehealth: Payer: Self-pay | Admitting: Nurse Practitioner

## 2014-03-05 NOTE — Telephone Encounter (Signed)
APPT MADE TO CHECK VAGINAL BLEEDING NOT PAP.

## 2014-03-07 ENCOUNTER — Encounter: Payer: Self-pay | Admitting: Nurse Practitioner

## 2014-03-07 ENCOUNTER — Ambulatory Visit (INDEPENDENT_AMBULATORY_CARE_PROVIDER_SITE_OTHER): Payer: BC Managed Care – PPO | Admitting: Nurse Practitioner

## 2014-03-07 ENCOUNTER — Encounter (INDEPENDENT_AMBULATORY_CARE_PROVIDER_SITE_OTHER): Payer: Self-pay

## 2014-03-07 VITALS — BP 140/89 | HR 69 | Temp 97.8°F | Ht 64.0 in | Wt 172.8 lb

## 2014-03-07 DIAGNOSIS — N898 Other specified noninflammatory disorders of vagina: Secondary | ICD-10-CM

## 2014-03-07 DIAGNOSIS — N939 Abnormal uterine and vaginal bleeding, unspecified: Secondary | ICD-10-CM

## 2014-03-07 MED ORDER — NORETHINDRONE-ETH ESTRADIOL 1-5 MG-MCG PO TABS: 1.0000 | ORAL_TABLET | Freq: Every day | ORAL | Status: DC

## 2014-03-07 MED ORDER — MEDROXYPROGESTERONE ACETATE 10 MG PO TABS: 10.0000 mg | ORAL_TABLET | Freq: Every day | ORAL | Status: DC

## 2014-03-07 NOTE — Patient Instructions (Signed)
Abnormal Uterine Bleeding Abnormal uterine bleeding means bleeding from the vagina that is not your normal menstrual period. This can be:  Bleeding or spotting between periods.  Bleeding after sex (sexual intercourse).  Bleeding that is heavier or more than normal.  Periods that last longer than usual.  Bleeding after menopause. There are many problems that may cause this. Treatment will depend on the cause of the bleeding. Any kind of bleeding that is not normal should be reviewed by your doctor.  HOME CARE Watch your condition for any changes. These actions may lessen any discomfort you are having:  Do not use tampons or douches as told by your doctor.  Change your pads often. You should get regular pelvic exams and Pap tests. Keep all appointments for tests as told by your doctor. GET HELP IF:  You are bleeding for more than 1 week.  You feel dizzy at times. GET HELP RIGHT AWAY IF:   You pass out.  You have to change pads every 15 to 30 minutes.  You have belly pain.  You have a fever.  You become sweaty or weak.  You are passing large blood clots from the vagina.  You feel sick to your stomach (nauseous) and throw up (vomit). MAKE SURE YOU:  Understand these instructions.  Will watch your condition.  Will get help right away if you are not doing well or get worse. Document Released: 08/15/2009 Document Revised: 08/08/2013 Document Reviewed: 05/17/2013 ExitCare Patient Information 2014 [employer].  

## 2014-03-07 NOTE — Progress Notes (Signed)
   Subjective:    Patient ID: Casey Drake, female    DOB: [birth year], 53 y.o.   MRN: [id number]  HPI Patient in c/o irregular vaginal bleeding- has had no menses in December of 2012- but has been on  Birth control all this time. Has been on [place] for several years. Had u/s in 07/13/11 for excessive bleeding and that is when she started ion jinteli- Had follow up in January 2014 but no pap was done.    Review of Systems  Constitutional: Negative.   HENT: Negative.   Respiratory: Negative.   Cardiovascular: Negative.   Genitourinary: Positive for vaginal bleeding.  Neurological: Negative.   Psychiatric/Behavioral: Negative.   All other systems reviewed and are negative.      Objective:   Physical Exam  Constitutional: She is oriented to person, place, and time. She appears well-developed and well-nourished.  Cardiovascular: Normal rate, regular rhythm and normal heart sounds.   Pulmonary/Chest: Effort normal and breath sounds normal.  Neurological: She is alert and oriented to person, place, and time.  Skin: Skin is warm and dry.  Psychiatric: She has a normal mood and affect. Her behavior is normal. Judgment and thought content normal.   BP 140/89  Pulse 69  Temp(Src) 97.8 F (36.6 C) (Oral)  Ht 5\' 4"  (1.626 m)  Wt 172 lb 12.8 oz (78.382 kg)  BMI 29.65 kg/m2  LMP 10/02/2011        Assessment & Plan:   1. Vaginal bleeding, abnormal    Meds ordered this encounter  Medications  . medroxyPROGESTERone (PROVERA) 10 MG tablet    Sig: Take 1 tablet (10 mg total) by mouth daily.    Dispense:  10 tablet    Refill:  0    Order Specific Question:  Supervising Provider    Answer:  Chipper Herb [[id number]]  . norethindrone-ethinyl estradiol (JINTELI) 1-5 MG-MCG TABS    Sig: Take 1 tablet by mouth daily.    Dispense:  28 tablet    Refill:  1    Order Specific Question:  Supervising Provider    Answer:  Joycelyn Man   Stop jinteli Take provera- have a period  then start back on jenteli or just wait and see what happens We may need to do bx in future  [name] Hassell Done, FNP

## 2014-04-08 ENCOUNTER — Telehealth: Payer: Self-pay | Admitting: Family Medicine

## 2014-04-08 NOTE — Telephone Encounter (Signed)
Nothing you can do-

## 2014-04-09 ENCOUNTER — Encounter: Payer: Self-pay | Admitting: Physician Assistant

## 2014-04-09 ENCOUNTER — Ambulatory Visit (INDEPENDENT_AMBULATORY_CARE_PROVIDER_SITE_OTHER): Payer: BC Managed Care – PPO | Admitting: Physician Assistant

## 2014-04-09 VITALS — BP 137/89 | HR 76 | Temp 97.9°F | Ht 64.0 in | Wt 174.4 lb

## 2014-04-09 DIAGNOSIS — N926 Irregular menstruation, unspecified: Secondary | ICD-10-CM

## 2014-04-09 NOTE — Patient Instructions (Signed)
Perimenopause Perimenopause is the time when your body begins to move into the menopause (no menstrual period for 12 straight months). It is a natural process. Perimenopause can begin 2 8 years before the menopause and usually lasts for 1 year after the menopause. During this time, your ovaries may or may not produce an egg. The ovaries vary in their production of estrogen and progesterone hormones each month. This can cause irregular menstrual periods, difficulty getting pregnant, vaginal bleeding between periods, and uncomfortable symptoms. CAUSES  Irregular production of the ovarian hormones, estrogen and progesterone, and not ovulating every month.  Other causes include:  Tumor of the pituitary gland in the brain.  Medical disease that affects the ovaries.  Radiation treatment.  Chemotherapy.  Unknown causes.  Heavy smoking and excessive alcohol intake can bring on perimenopause sooner. SIGNS AND SYMPTOMS   Hot flashes.  Night sweats.  Irregular menstrual periods.  Decreased sex drive.  Vaginal dryness.  Headaches.  Mood swings.  Depression.  Memory problems.  Irritability.  Tiredness.  Weight gain.  Trouble getting pregnant.  The beginning of losing bone cells (osteoporosis).  The beginning of hardening of the arteries (atherosclerosis). DIAGNOSIS  Your health care provider will make a diagnosis by analyzing your age, menstrual history, and symptoms. He or she will do a physical exam and note any changes in your body, especially your female organs. Female hormone tests may or may not be helpful depending on the amount of female hormones you produce and when you produce them. However, other hormone tests may be helpful to rule out other problems. TREATMENT  In some cases, no treatment is needed. The decision on whether treatment is necessary during the perimenopause should be made by you and your health care provider based on how the symptoms are affecting you  and your lifestyle. Various treatments are available, such as:  Treating individual symptoms with a specific medicine for that symptom.  Herbal medicines that can help specific symptoms.  Counseling.  Group therapy. HOME CARE INSTRUCTIONS   Keep track of your menstrual periods (when they occur, how heavy they are, how long between periods, and how long they last) as well as your symptoms and when they started.  Only take over-the-counter or prescription medicines as directed by your health care provider.  Sleep and rest.  Exercise.  Eat a diet that contains calcium (good for your bones) and soy (acts like the estrogen hormone).  Do not smoke.  Avoid alcoholic beverages.  Take vitamin supplements as recommended by your health care provider. Taking vitamin E may help in certain cases.  Take calcium and vitamin D supplements to help prevent bone loss.  Group therapy is sometimes helpful.  Acupuncture may help in some cases. SEEK MEDICAL CARE IF:   You have questions about any symptoms you are having.  You need a referral to a specialist (gynecologist, psychiatrist, or psychologist). SEEK IMMEDIATE MEDICAL CARE IF:   You have vaginal bleeding.  Your period lasts longer than 8 days.  Your periods are recurring sooner than 21 days.  You have bleeding after intercourse.  You have severe depression.  You have pain when you urinate.  You have severe headaches.  You have vision problems. Document Released: 11/25/2004 Document Revised: 08/08/2013 Document Reviewed: 05/17/2013 [facility] Patient Information 2014 [place], [place].

## 2014-04-09 NOTE — Progress Notes (Signed)
Subjective:     Patient ID: Casey Drake, female   DOB: 06-06-1961, 53 y.o.   MRN: [id number]  HPI Pt here due to hot flashes She is a regular pt of [facility] Pt has been on OCP for the last 3 years She was recently taken off of them due to vaginal bleeding Over the last 6 weeks she has noticed increased sx of hot flashes and mood instability She has gotten to the point she is not able to work due to the sx and lack of sleep Would like testing to confirm No OTC meds used  Review of Systems + hot flashes/sweats Denies any vaginal bleeding or cramping + mood swings    Objective:   Physical Exam FSH/TSH/LH pending    Assessment:     Irregular menses- ? perimenopausal    Plan:     Spent 15 min face to face with pt answering questions and discussing tx options Labs done today Recommended f/u with [facility] Forms filled out today for [facility]

## 2014-04-09 NOTE — Telephone Encounter (Signed)
Spoke with patient and she is not on birth control right now. She ended up not starting it back and she wants to know if this could be coming from menopause and could we do labs or something to see if she is in menopause. She says the hot flashes are unbearable

## 2014-04-10 LAB — TSH: TSH: 2.82 u[IU]/mL (ref 0.450–4.500)

## 2014-04-10 LAB — FSH/LH
FSH: 73.8 m[IU]/mL
LH: 34.1 m[IU]/mL

## 2014-04-10 LAB — PROLACTIN: Prolactin: 13 ng/mL (ref 4.8–23.3)

## 2014-04-10 NOTE — Telephone Encounter (Signed)
ptaient was seen by B. [place]

## 2014-04-22 ENCOUNTER — Other Ambulatory Visit: Payer: BC Managed Care – PPO | Admitting: Nurse Practitioner

## 2014-04-25 ENCOUNTER — Encounter: Payer: Self-pay | Admitting: Nurse Practitioner

## 2014-04-25 ENCOUNTER — Ambulatory Visit (INDEPENDENT_AMBULATORY_CARE_PROVIDER_SITE_OTHER): Payer: BC Managed Care – PPO | Admitting: Nurse Practitioner

## 2014-04-25 VITALS — BP 124/80 | HR 76 | Temp 97.5°F | Ht 64.0 in | Wt 172.0 lb

## 2014-04-25 DIAGNOSIS — R232 Flushing: Secondary | ICD-10-CM

## 2014-04-25 DIAGNOSIS — N951 Menopausal and female climacteric states: Secondary | ICD-10-CM

## 2014-04-25 NOTE — Patient Instructions (Signed)
Menopause Menopause is the normal time of life when menstrual periods stop completely. Menopause is complete when you have missed 12 consecutive menstrual periods. It usually occurs between the ages of 54 years and 55 years. Very rarely does a woman develop menopause before the age of 54 years. At menopause, your ovaries stop producing the female hormones estrogen and progesterone. This can cause undesirable symptoms and also affect your health. Sometimes the symptoms may occur 4-5 years before the menopause begins. There is no relationship between menopause and:  Oral contraceptives.  Number of children you had.  Race.  The age your menstrual periods started (menarche). Heavy smokers and very thin women may develop menopause earlier in life. CAUSES  The ovaries stop producing the female hormones estrogen and progesterone.  Other causes include:  Surgery to remove both ovaries.  The ovaries stop functioning for no known reason.  Tumors of the pituitary gland in the brain.  Medical disease that affects the ovaries and hormone production.  Radiation treatment to the abdomen or pelvis.  Chemotherapy that affects the ovaries. SYMPTOMS   Hot flashes.  Night sweats.  Decrease in sex drive.  Vaginal dryness and thinning of the vagina causing painful intercourse.  Dryness of the skin and developing wrinkles.  Headaches.  Tiredness.  Irritability.  Memory problems.  Weight gain.  Bladder infections.  Hair growth of the face and chest.  Infertility. More serious symptoms include:  Loss of bone (osteoporosis) causing breaks (fractures).  Depression.  Hardening and narrowing of the arteries (atherosclerosis) causing heart attacks and strokes. DIAGNOSIS   When the menstrual periods have stopped for 12 straight months.  Physical exam.  Hormone studies of the blood. TREATMENT  There are many treatment choices and nearly as many questions about them. The  decisions to treat or not to treat menopausal changes is an individual choice made with your health care provider. Your health care provider can discuss the treatments with you. Together, you can decide which treatment will work best for you. Your treatment choices may include:   Hormone therapy (estrogen and progesterone).  Non-hormonal medicines.  Treating the individual symptoms with medicine (for example antidepressants for depression).  Herbal medicines that may help specific symptoms.  Counseling by a psychiatrist or psychologist.  Group therapy.  Lifestyle changes including:  Eating healthy.  Regular exercise.  Limiting caffeine and alcohol.  Stress management and meditation.  No treatment. HOME CARE INSTRUCTIONS   Take the medicine your health care provider gives you as directed.  Get plenty of sleep and rest.  Exercise regularly.  Eat a diet that contains calcium (good for the bones) and soy products (acts like estrogen hormone).  Avoid alcoholic beverages.  Do not smoke.  If you have hot flashes, dress in layers.  Take supplements, calcium, and vitamin D to strengthen bones.  You can use over-the-counter lubricants or moisturizers for vaginal dryness.  Group therapy is sometimes very helpful.  Acupuncture may be helpful in some cases. SEEK MEDICAL CARE IF:   You are not sure you are in menopause.  You are having menopausal symptoms and need advice and treatment.  You are still having menstrual periods after age 54 years.  You have pain with intercourse.  Menopause is complete (no menstrual period for 12 months) and you develop vaginal bleeding.  You need a referral to a specialist (gynecologist, psychiatrist, or psychologist) for treatment. SEEK IMMEDIATE MEDICAL CARE IF:   You have severe depression.  You have excessive vaginal bleeding.    You fell and think you have a broken bone.  You have pain when you urinate.  You develop leg or  chest pain.  You have a fast pounding heart beat (palpitations).  You have severe headaches.  You develop vision problems.  You feel a lump in your breast.  You have abdominal pain or severe indigestion. Document Released: 01/08/2004 Document Revised: 06/20/2013 Document Reviewed: 05/17/2013 Exit[employer] Patient Information 2015 [employer]. This information is not intended to replace advice given to you by your health care provider. Make sure you discuss any questions you have with your health care provider.  

## 2014-04-25 NOTE — Progress Notes (Signed)
   Subjective:    Patient ID: Casey Drake, female    DOB: 27-Mar-1961, 53 y.o.   MRN: [id number]  HPI Patient here today to discuss menopause- Is having hot flashes- LMP was May13, 2015- SHe was on birth control but has stopped. Blood work showed that she is post menopausal.    Review of Systems  Constitutional: Negative.   Respiratory: Negative.   Cardiovascular: Negative.   Neurological: Negative.   Psychiatric/Behavioral: Negative.   All other systems reviewed and are negative.      Objective:   Physical Exam  Constitutional: She is oriented to person, place, and time. She appears well-developed and well-nourished.  Cardiovascular: Normal rate, regular rhythm and normal heart sounds.   Pulmonary/Chest: Effort normal and breath sounds normal.  Neurological: She is alert and oriented to person, place, and time.  Skin: Skin is warm and dry.  Psychiatric: She has a normal mood and affect. Her behavior is normal. Judgment and thought content normal.   BP 124/80  Pulse 76  Temp(Src) 97.5 F (36.4 C) (Oral)  Ht 5\' 4"  (1.626 m)  Wt 172 lb (78.019 kg)  BMI 29.51 kg/m2  LMP 03/11/2014         Assessment & Plan:   1. Vasomotor flushing   2. Menopause syndrome    Discussed horomone and decided to wait Paient is going to try [place] prn  [place],

## 2014-05-21 ENCOUNTER — Telehealth: Payer: Self-pay | Admitting: Nurse Practitioner

## 2014-05-21 MED ORDER — DOXYCYCLINE HYCLATE 100 MG PO TABS: 100.0000 mg | ORAL_TABLET | Freq: Two times a day (BID) | ORAL | Status: DC

## 2014-05-21 NOTE — Telephone Encounter (Signed)
Doxy I sent the Rx to the pharmacy.  

## 2014-05-28 ENCOUNTER — Other Ambulatory Visit: Payer: Self-pay | Admitting: Family Medicine

## 2014-05-28 ENCOUNTER — Telehealth: Payer: Self-pay | Admitting: Nurse Practitioner

## 2014-05-28 MED ORDER — ALPRAZOLAM 1 MG PO TABS: 1.0000 mg | ORAL_TABLET | Freq: Every evening | ORAL | Status: DC | PRN

## 2014-05-28 NOTE — Telephone Encounter (Signed)
Please call in xanax 1mg  1 po QHs #30 with 1 refills

## 2014-05-28 NOTE — Telephone Encounter (Signed)
Called in.

## 2014-06-04 ENCOUNTER — Ambulatory Visit (INDEPENDENT_AMBULATORY_CARE_PROVIDER_SITE_OTHER): Payer: BC Managed Care – PPO | Admitting: Nurse Practitioner

## 2014-06-04 ENCOUNTER — Encounter: Payer: Self-pay | Admitting: Nurse Practitioner

## 2014-06-04 VITALS — BP 136/88 | HR 80 | Temp 96.8°F | Ht 64.0 in | Wt 174.0 lb

## 2014-06-04 DIAGNOSIS — Z23 Encounter for immunization: Secondary | ICD-10-CM

## 2014-06-04 DIAGNOSIS — S61210A Laceration without foreign body of right index finger without damage to nail, initial encounter: Secondary | ICD-10-CM

## 2014-06-04 DIAGNOSIS — S61209A Unspecified open wound of unspecified finger without damage to nail, initial encounter: Secondary | ICD-10-CM

## 2014-06-04 NOTE — Patient Instructions (Signed)

## 2014-06-04 NOTE — Progress Notes (Addendum)
   Subjective:    Patient ID: Casey Drake, female    DOB: Oct 17, 1961, 53 y.o.   MRN: [id number]  HPI patient was slicing corn off the cob and lacerated her right index finger. Bleeding difficult to stop. No numbness on tip of finger.    Review of Systems  Constitutional: Negative.   HENT: Negative.   Respiratory: Negative.   Cardiovascular: Negative.   Genitourinary: Negative.   Neurological: Negative.   Psychiatric/Behavioral: Negative.   All other systems reviewed and are negative.      Objective:   Physical Exam  Constitutional: She appears well-developed and well-nourished.  Cardiovascular: Normal rate, regular rhythm and normal heart sounds.   Pulmonary/Chest: Effort normal and breath sounds normal.  Skin:     2cm laceration palm surface of right index finger  Psychiatric: She has a normal mood and affect. Her behavior is normal. Judgment and thought content normal.     Ptrocedure:  Lidocaine 2% plain 1cc local  Cleaned with Betadine  4-0 ethilon 2 simple interrupted stitches  Cleaned with saline  tubular dressing applied  Patient tolerated well     Assessment & Plan:   1. Laceration of right index finger w/o foreign body w/o damage to nail, initial encounter    boosterix given Keep clean and dry Watch fo rsigns of infection Stitch removal in 10 days  Lena, FNP

## 2014-06-10 ENCOUNTER — Telehealth: Payer: Self-pay | Admitting: Nurse Practitioner

## 2014-06-10 NOTE — Telephone Encounter (Signed)
Pt given appointment for Dx mammogram at [place] in [facility]

## 2014-06-17 ENCOUNTER — Other Ambulatory Visit: Payer: Self-pay | Admitting: *Deleted

## 2014-06-27 ENCOUNTER — Other Ambulatory Visit: Payer: Self-pay | Admitting: *Deleted

## 2014-06-27 MED ORDER — TRAZODONE HCL 50 MG PO TABS: ORAL_TABLET | ORAL | Status: DC

## 2014-06-27 NOTE — Telephone Encounter (Signed)
Last ov 06/04/14. Med not listed in Epic. Last refill 03/27/14.

## 2014-08-09 ENCOUNTER — Other Ambulatory Visit: Payer: Self-pay | Admitting: Nurse Practitioner

## 2014-08-13 ENCOUNTER — Telehealth: Payer: Self-pay | Admitting: Family Medicine

## 2014-08-14 ENCOUNTER — Other Ambulatory Visit: Payer: Self-pay | Admitting: Nurse Practitioner

## 2014-08-15 NOTE — Telephone Encounter (Signed)
Last seen 06/04/14  MMM

## 2014-08-26 NOTE — Telephone Encounter (Signed)
Patient not seen.

## 2014-08-27 ENCOUNTER — Telehealth: Payer: Self-pay | Admitting: Nurse Practitioner

## 2014-08-27 NOTE — Telephone Encounter (Signed)
Pt given appt w/mmm Friday @1 :30

## 2014-08-30 ENCOUNTER — Encounter: Payer: Self-pay | Admitting: Nurse Practitioner

## 2014-08-30 ENCOUNTER — Ambulatory Visit (INDEPENDENT_AMBULATORY_CARE_PROVIDER_SITE_OTHER): Payer: BC Managed Care – PPO | Admitting: Nurse Practitioner

## 2014-08-30 VITALS — BP 138/87 | HR 77 | Temp 97.2°F | Ht 64.0 in | Wt 179.0 lb

## 2014-08-30 DIAGNOSIS — M545 Low back pain, unspecified: Secondary | ICD-10-CM

## 2014-08-30 DIAGNOSIS — Z23 Encounter for immunization: Secondary | ICD-10-CM

## 2014-08-30 MED ORDER — KETOROLAC TROMETHAMINE 60 MG/2ML IM SOLN
60.0000 mg | Freq: Once | INTRAMUSCULAR | Status: AC
  Administered 2014-08-30: 60 mg via INTRAMUSCULAR

## 2014-08-30 NOTE — Patient Instructions (Signed)

## 2014-08-30 NOTE — Progress Notes (Signed)
   Subjective:    Patient ID: Casey Drake, female    DOB: 08-22-1961, 53 y.o.   MRN: [id number]  HPI Patient in today c/o back pain- she comes in and gets toradol injection that usually last her for awhile. SHe also has a lesion on her left hip that she wants looked at.    Review of Systems  Constitutional: Negative.   HENT: Negative.   Respiratory: Negative.   Cardiovascular: Negative.   Genitourinary: Negative.   Neurological: Negative.   Psychiatric/Behavioral: Negative.   All other systems reviewed and are negative.      Objective:   Physical Exam  Constitutional: She appears well-developed and well-nourished.  Cardiovascular: Normal rate, regular rhythm and normal heart sounds.   Pulmonary/Chest: Effort normal and breath sounds normal.  Skin: Skin is warm.  1 cm slightly raised brownish lesion on left hip.  Psychiatric: She has a normal mood and affect. Her behavior is normal. Judgment and thought content normal.   BP 138/87  Pulse 77  Temp(Src) 97.2 F (36.2 C) (Oral)  Ht 5\' 4"  (1.626 m)  Wt 179 lb (81.194 kg)  BMI 30.71 kg/m2  Cryotherapy to small lesion to left hip      Assessment & Plan:   1. Midline low back pain without sciatica    Meds ordered this encounter  Medications  . ketorolac (TORADOL) injection 60 mg    Sig:    Moist heat  Rest No heavy lifting RTO prn  [name] Hassell Done, FNP

## 2014-10-01 ENCOUNTER — Telehealth: Payer: Self-pay | Admitting: Nurse Practitioner

## 2014-10-01 NOTE — Telephone Encounter (Signed)
Appointment given for tomorrow with [place] at 2:30. Patient advised if she wants to be seen today then she could also try urgent care.

## 2014-10-02 ENCOUNTER — Ambulatory Visit: Payer: BC Managed Care – PPO | Admitting: Family Medicine

## 2014-10-14 ENCOUNTER — Ambulatory Visit: Payer: Self-pay | Admitting: Nurse Practitioner

## 2014-11-06 ENCOUNTER — Other Ambulatory Visit: Payer: Self-pay | Admitting: Nurse Practitioner

## 2014-11-12 ENCOUNTER — Encounter: Payer: Self-pay | Admitting: Nurse Practitioner

## 2014-11-12 ENCOUNTER — Ambulatory Visit (INDEPENDENT_AMBULATORY_CARE_PROVIDER_SITE_OTHER): Payer: BLUE CROSS/BLUE SHIELD | Admitting: Nurse Practitioner

## 2014-11-12 VITALS — BP 141/89 | HR 80 | Temp 97.5°F | Ht 64.0 in | Wt 177.0 lb

## 2014-11-12 DIAGNOSIS — R05 Cough: Secondary | ICD-10-CM

## 2014-11-12 DIAGNOSIS — J0101 Acute recurrent maxillary sinusitis: Secondary | ICD-10-CM

## 2014-11-12 DIAGNOSIS — R059 Cough, unspecified: Secondary | ICD-10-CM

## 2014-11-12 MED ORDER — BENZONATATE 100 MG PO CAPS: 100.0000 mg | ORAL_CAPSULE | Freq: Two times a day (BID) | ORAL | Status: DC | PRN

## 2014-11-12 MED ORDER — AMOXICILLIN 875 MG PO TABS: 875.0000 mg | ORAL_TABLET | Freq: Two times a day (BID) | ORAL | Status: DC

## 2014-11-12 NOTE — Patient Instructions (Signed)

## 2014-11-12 NOTE — Progress Notes (Signed)
Subjective:    Patient ID: Casey Drake, female    DOB: 07-01-1961, 54 y.o.   MRN: [id number]  HPI Patient in today c/o recurrence of cough and nasal congestion- This occurs about every 3 months- low grade fever intermittently. Slight headache. Congestion is clear.    Review of Systems  Constitutional: Negative for fever and chills.  HENT: Positive for congestion, rhinorrhea and sinus pressure. Negative for ear pain, sore throat and trouble swallowing.   Respiratory: Positive for cough.   Cardiovascular: Negative.   Gastrointestinal: Negative.   Genitourinary: Negative.   Neurological: Negative.   Psychiatric/Behavioral: Negative.   All other systems reviewed and are negative.      Objective:   Physical Exam  Constitutional: She is oriented to person, place, and time. She appears well-developed and well-nourished. No distress.  HENT:  Right Ear: Hearing, tympanic membrane, external ear and ear canal normal.  Left Ear: Hearing, tympanic membrane, external ear and ear canal normal.  Nose: Mucosal edema and rhinorrhea present. Right sinus exhibits maxillary sinus tenderness and frontal sinus tenderness. Left sinus exhibits maxillary sinus tenderness and frontal sinus tenderness.  Mouth/Throat: Uvula is midline, oropharynx is clear and moist and mucous membranes are normal.  Eyes: Pupils are equal, round, and reactive to light.  Neck: Normal range of motion. Neck supple.  Cardiovascular: Normal rate, regular rhythm and normal heart sounds.   Pulmonary/Chest: Effort normal and breath sounds normal. No respiratory distress. She has no wheezes. She has no rales.  Dry cough  Abdominal: Soft. Bowel sounds are normal.  Lymphadenopathy:    She has no cervical adenopathy.  Neurological: She is alert and oriented to person, place, and time.  Skin: Skin is warm and dry.  Psychiatric: She has a normal mood and affect. Her behavior is normal. Judgment and thought content normal.    BP  141/89 mmHg  Pulse 80  Temp(Src) 97.5 F (36.4 C) (Oral)  Ht 5\' 4"  (1.626 m)  Wt 177 lb (80.287 kg)  BMI 30.37 kg/m2       Assessment & Plan:   1. Acute recurrent maxillary sinusitis   2. Cough    Meds ordered this encounter  Medications  . amoxicillin (AMOXIL) 875 MG tablet    Sig: Take 1 tablet (875 mg total) by mouth 2 (two) times daily.    Dispense:  20 tablet    Refill:  0    Order Specific Question:  Supervising Provider    Answer:  Chipper Herb [[id number]]  . benzonatate (TESSALON) 100 MG capsule    Sig: Take 1 capsule (100 mg total) by mouth 2 (two) times daily as needed for cough.    Dispense:  20 capsule    Refill:  0    Order Specific Question:  Supervising Provider    Answer:  Chipper Herb [[id number]]   1. Take meds as prescribed 2. Use a cool mist humidifier especially during the winter months and when heat has been humid. 3. Use saline nose sprays frequently 4. Saline irrigations of the nose can be very helpful if done frequently.  * 4X daily for 1 week*  * Use of a nettie pot can be helpful with this. Follow directions with this* 5. Drink plenty of fluids 6. Keep thermostat turn down low 7.For any cough or congestion  Use plain Mucinex- regular strength or max strength is fine- NO DECONGESTANTS   * Children- consult with Pharmacist for dosing 8. For fever or aces or pains- take  tylenol or ibuprofen appropriate for age and weight.  * for fevers greater than 101 orally you may alternate ibuprofen and tylenol every  3 hours.   [name] Hassell Done, FNP

## 2014-11-16 ENCOUNTER — Ambulatory Visit (INDEPENDENT_AMBULATORY_CARE_PROVIDER_SITE_OTHER): Payer: BLUE CROSS/BLUE SHIELD | Admitting: Nurse Practitioner

## 2014-11-16 ENCOUNTER — Encounter: Payer: Self-pay | Admitting: Nurse Practitioner

## 2014-11-16 VITALS — BP 157/102 | HR 67 | Temp 96.7°F | Ht 64.0 in | Wt 176.0 lb

## 2014-11-16 DIAGNOSIS — J209 Acute bronchitis, unspecified: Secondary | ICD-10-CM

## 2014-11-16 MED ORDER — METHYLPREDNISOLONE ACETATE 80 MG/ML IJ SUSP
80.0000 mg | Freq: Once | INTRAMUSCULAR | Status: AC
  Administered 2014-11-16: 80 mg via INTRAMUSCULAR

## 2014-11-16 MED ORDER — AZITHROMYCIN 250 MG PO TABS: ORAL_TABLET | ORAL | Status: DC

## 2014-11-16 NOTE — Progress Notes (Signed)
   Subjective:    Patient ID: Casey Drake, female    DOB: 06-16-1961, 54 y.o.   MRN: [id number]  HPI Patient in c/o not feeling any better- She was seen Tuesday January 12,2016- cough has now moved down into chest and it hurts for her to breathe- Has been using mucinex at home. Denies any fever.    Review of Systems  Constitutional: Negative for fever and chills.  HENT: Positive for congestion and rhinorrhea.   Respiratory: Positive for cough (productive thick and yellow).   Cardiovascular: Negative.   Genitourinary: Negative.   Neurological: Negative.   Psychiatric/Behavioral: Negative.   All other systems reviewed and are negative.      Objective:   Physical Exam  Constitutional: She is oriented to person, place, and time. She appears well-developed and well-nourished.  HENT:  Right Ear: Hearing, tympanic membrane, external ear and ear canal normal.  Left Ear: Hearing, tympanic membrane, external ear and ear canal normal.  Nose: Mucosal edema and rhinorrhea present. Right sinus exhibits no maxillary sinus tenderness and no frontal sinus tenderness. Left sinus exhibits no maxillary sinus tenderness and no frontal sinus tenderness.  Mouth/Throat: Uvula is midline, oropharynx is clear and moist and mucous membranes are normal.  Neck: Normal range of motion. Neck supple.  Cardiovascular: Normal rate and normal heart sounds.   Pulmonary/Chest: Effort normal and breath sounds normal. No respiratory distress. She has no wheezes. She has no rales.  rhinchi throughout- deep dry cough  Neurological: She is alert and oriented to person, place, and time.  Skin: Skin is warm and dry.  Psychiatric: She has a normal mood and affect. Her behavior is normal. Judgment and thought content normal.   BP 157/102 mmHg  Pulse 67  Temp(Src) 96.7 F (35.9 C) (Oral)  Ht 5\' 4"  (1.626 m)  Wt 176 lb (79.833 kg)  BMI 30.20 kg/m2        Assessment & Plan:   1. Acute bronchitis, unspecified  organism    Meds ordered this encounter  Medications  . azithromycin (ZITHROMAX Z-PAK) 250 MG tablet    Sig: As directed    Dispense:  6 each    Refill:  0    Order Specific Question:  Supervising Provider    Answer:  Chipper Herb [[id number]]  . methylPREDNISolone acetate (DEPO-MEDROL) injection 80 mg    Sig:    Ok to finish amoxicillin Force fluids Continue mucinex as rx Humidifier rto prn   [name] Hassell Done, FNP

## 2014-11-18 ENCOUNTER — Other Ambulatory Visit: Payer: Self-pay | Admitting: Nurse Practitioner

## 2014-12-18 ENCOUNTER — Other Ambulatory Visit: Payer: Self-pay | Admitting: Nurse Practitioner

## 2014-12-18 NOTE — Telephone Encounter (Signed)
Last seen 11/16/14 MMM If approved route to nurse to call into [facility]

## 2014-12-19 NOTE — Telephone Encounter (Signed)
Please call in xanax with 1 refills 

## 2014-12-20 NOTE — Telephone Encounter (Signed)
rx [facility]

## 2015-01-03 ENCOUNTER — Encounter: Payer: Self-pay | Admitting: Gastroenterology

## 2015-01-17 ENCOUNTER — Encounter: Payer: Self-pay | Admitting: Physician Assistant

## 2015-01-17 ENCOUNTER — Ambulatory Visit (INDEPENDENT_AMBULATORY_CARE_PROVIDER_SITE_OTHER): Payer: BLUE CROSS/BLUE SHIELD | Admitting: Physician Assistant

## 2015-01-17 VITALS — BP 160/93 | HR 61 | Temp 96.6°F | Ht 64.0 in | Wt 176.4 lb

## 2015-01-17 DIAGNOSIS — J208 Acute bronchitis due to other specified organisms: Secondary | ICD-10-CM | POA: Diagnosis not present

## 2015-01-17 DIAGNOSIS — J0101 Acute recurrent maxillary sinusitis: Secondary | ICD-10-CM | POA: Diagnosis not present

## 2015-01-17 MED ORDER — FLUTICASONE PROPIONATE 50 MCG/ACT NA SUSP: 2.0000 | Freq: Every day | NASAL | Status: DC

## 2015-01-17 MED ORDER — DOXYCYCLINE HYCLATE 100 MG PO TABS: 100.0000 mg | ORAL_TABLET | Freq: Two times a day (BID) | ORAL | Status: DC

## 2015-01-17 MED ORDER — ALBUTEROL SULFATE HFA 108 (90 BASE) MCG/ACT IN AERS: 2.0000 | INHALATION_SPRAY | Freq: Four times a day (QID) | RESPIRATORY_TRACT | Status: DC | PRN

## 2015-01-17 MED ORDER — PREDNISONE (PAK) 10 MG PO TABS: ORAL_TABLET | ORAL | Status: DC

## 2015-01-17 MED ORDER — BENZONATATE 100 MG PO CAPS: 100.0000 mg | ORAL_CAPSULE | Freq: Two times a day (BID) | ORAL | Status: DC | PRN

## 2015-01-17 NOTE — Progress Notes (Signed)
   Subjective:    Patient ID: Casey Drake, female    DOB: 05-04-1961, 54 y.o.   MRN: [id number]  HPI 53 y/o female presents with productive cough and head congestion intermittently since January. Was treated with Amoxicillin x 5 days and a zpak after that with some relief. Within the past week symptoms have recurred and are worse than before. Has been taking Nyquil with some relief and Tessalon for cough.    Review of Systems  Constitutional: Negative for fever and chills.  HENT: Positive for congestion (nasal and chest), ear pain, postnasal drip, rhinorrhea, sinus pressure, sneezing and sore throat.   Respiratory: Positive for cough and chest tightness. Negative for shortness of breath and wheezing.   Cardiovascular: Negative.  Negative for chest pain.  Neurological: Negative for dizziness and light-headedness.       Objective:   Physical Exam  Constitutional: She is oriented to person, place, and time. She appears well-developed and well-nourished.  HENT:  Right Ear: External ear normal.  Left Ear: External ear normal.  Mouth/Throat: No oropharyngeal exudate.  TTP of maxillary sinuses bilaterlly, nasal congestion  Eyes: Conjunctivae are normal.  Neck: No JVD present. No tracheal deviation present. No thyromegaly present.  Cardiovascular: Normal rate, regular rhythm and normal heart sounds.  Exam reveals no gallop and no friction rub.   No murmur heard. Pulmonary/Chest: Effort normal. No stridor. No respiratory distress. She has wheezes (expiratory bilaterally ). She has no rales. She exhibits no tenderness.  Abdominal: She exhibits no distension and no mass. There is no tenderness. There is no rebound and no guarding.  Lymphadenopathy:    She has no cervical adenopathy.  Neurological: She is alert and oriented to person, place, and time. She has normal reflexes.  Vitals reviewed.         Assessment & Plan:  1. Bacterial Sinusitis: Levaquin 500mg  q day x 10  days  Flonase q day as directed  Musinex ( plain) as directed  2. Acute Bronchitis: Levaquin 500 mg q days x 10 days, Prednisone dose pack, Tessalon 100mg  prn for cough 3. HYpertension   F/u in 2 weeks for reassessment and to address HTN

## 2015-02-04 ENCOUNTER — Other Ambulatory Visit: Payer: Self-pay | Admitting: Nurse Practitioner

## 2015-02-06 ENCOUNTER — Other Ambulatory Visit: Payer: Self-pay | Admitting: Nurse Practitioner

## 2015-03-17 ENCOUNTER — Other Ambulatory Visit: Payer: Self-pay | Admitting: Nurse Practitioner

## 2015-04-16 ENCOUNTER — Encounter: Payer: Self-pay | Admitting: *Deleted

## 2015-04-16 ENCOUNTER — Ambulatory Visit (INDEPENDENT_AMBULATORY_CARE_PROVIDER_SITE_OTHER): Payer: BLUE CROSS/BLUE SHIELD | Admitting: Physician Assistant

## 2015-04-16 ENCOUNTER — Encounter: Payer: Self-pay | Admitting: Physician Assistant

## 2015-04-16 VITALS — BP 148/100 | HR 76 | Temp 97.1°F | Ht 64.0 in | Wt 175.2 lb

## 2015-04-16 DIAGNOSIS — M25551 Pain in right hip: Secondary | ICD-10-CM

## 2015-04-16 DIAGNOSIS — M545 Low back pain, unspecified: Secondary | ICD-10-CM

## 2015-04-16 DIAGNOSIS — M79605 Pain in left leg: Secondary | ICD-10-CM

## 2015-04-16 MED ORDER — KETOROLAC TROMETHAMINE 60 MG/2ML IM SOLN
60.0000 mg | Freq: Once | INTRAMUSCULAR | Status: AC
  Administered 2015-04-16: 60 mg via INTRAMUSCULAR

## 2015-04-16 NOTE — Progress Notes (Signed)
Subjective:     Patient ID: Casey Drake, female   DOB: 1961/05/07, 54 y.o.   MRN: [id number]  HPI Pt with sudden onset of LBP several days ago She has a long intermit hx of same  Prev seen by Dr Nelva Bush and had intermit ESI injections for her sx Pt now with L LBP and L radicular leg pain No hx to the area She has used pain med and Flexeril from prev rx but sx continue No change in bowel/bladder function States sitting makes sx worse  Review of Systems  Constitutional: Positive for activity change.  Gastrointestinal: Negative.   Genitourinary: Negative.   Musculoskeletal: Positive for back pain and gait problem.       Objective:   Physical Exam NAD + TTP over the SI joint area bilat L>R No palp spasm FROM of the L-spine- flexion increases sx, extension improves SLR neg Muscle strength testing distal good and equal bilat Sensory good distal DTR 1+/= lower ext    Assessment:     Bilat LBP with L radicular leg pain    Plan:     She has done well with Toradol 60mg  IM for sx This was done today Pt to continue her others meds Given radicular sx- if they continue she is to f/u with Dr Nelva Bush Papers filled out for work today F/U prn

## 2015-04-16 NOTE — Patient Instructions (Signed)
Back Pain, Adult Low back pain is very common. About 1 in 5 people have back pain.The cause of low back pain is rarely dangerous. The pain often gets better over time.About half of people with a sudden onset of back pain feel better in just 2 weeks. About 8 in 10 people feel better by 6 weeks.  CAUSES Some common causes of back pain include:  Strain of the muscles or ligaments supporting the spine.  Wear and tear (degeneration) of the spinal discs.  Arthritis.  Direct injury to the back. DIAGNOSIS Most of the time, the direct cause of low back pain is not known.However, back pain can be treated effectively even when the exact cause of the pain is unknown.Answering your caregiver's questions about your overall health and symptoms is one of the most accurate ways to make sure the cause of your pain is not dangerous. If your caregiver needs more information, he or she may order lab work or imaging tests (X-rays or MRIs).However, even if imaging tests show changes in your back, this usually does not require surgery. HOME CARE INSTRUCTIONS For many people, back pain returns.Since low back pain is rarely dangerous, it is often a condition that people can learn to manageon their own.   Remain active. It is stressful on the back to sit or stand in one place. Do not sit, drive, or stand in one place for more than 30 minutes at a time. Take short walks on level surfaces as soon as pain allows.Try to increase the length of time you walk each day.  Do not stay in bed.Resting more than 1 or 2 days can delay your recovery.  Do not avoid exercise or work.Your body is made to move.It is not dangerous to be active, even though your back may hurt.Your back will likely heal faster if you return to being active before your pain is gone.  Pay attention to your body when you bend and lift. Many people have less discomfortwhen lifting if they bend their knees, keep the load close to their bodies,and  avoid twisting. Often, the most comfortable positions are those that put less stress on your recovering back.  Find a comfortable position to sleep. Use a firm mattress and lie on your side with your knees slightly bent. If you lie on your back, put a pillow under your knees.  Only take over-the-counter or prescription medicines as directed by your caregiver. Over-the-counter medicines to reduce pain and inflammation are often the most helpful.Your caregiver may prescribe muscle relaxant drugs.These medicines help dull your pain so you can more quickly return to your normal activities and healthy exercise.  Put ice on the injured area.  Put ice in a plastic bag.  Place a towel between your skin and the bag.  Leave the ice on for 15-20 minutes, 03-04 times a day for the first 2 to 3 days. After that, ice and heat may be alternated to reduce pain and spasms.  Ask your caregiver about trying back exercises and gentle massage. This may be of some benefit.  Avoid feeling anxious or stressed.Stress increases muscle tension and can worsen back pain.It is important to recognize when you are anxious or stressed and learn ways to manage it.Exercise is a great option. SEEK MEDICAL CARE IF:  You have pain that is not relieved with rest or medicine.  You have pain that does not improve in 1 week.  You have new symptoms.  You are generally not feeling well. SEEK   IMMEDIATE MEDICAL CARE IF:   You have pain that radiates from your back into your legs.  You develop new bowel or bladder control problems.  You have unusual weakness or numbness in your arms or legs.  You develop nausea or vomiting.  You develop abdominal pain.  You feel faint. Document Released: 10/18/2005 Document Revised: 04/18/2012 Document Reviewed: 02/19/2014 ExitCare Patient Information 2015 [employer]. This information is not intended to replace advice given to you by your health care provider. Make sure you  discuss any questions you have with your health care provider.  

## 2015-05-02 ENCOUNTER — Other Ambulatory Visit: Payer: Self-pay

## 2015-05-02 MED ORDER — CITALOPRAM HYDROBROMIDE 20 MG PO TABS: 20.0000 mg | ORAL_TABLET | Freq: Every day | ORAL | Status: DC

## 2015-05-06 ENCOUNTER — Other Ambulatory Visit: Payer: Self-pay

## 2015-05-06 MED ORDER — TRAZODONE HCL 50 MG PO TABS: 50.0000 mg | ORAL_TABLET | Freq: Every day | ORAL | Status: DC

## 2015-05-06 NOTE — Telephone Encounter (Signed)
Last seen 04/16/15  [facility]

## 2015-05-23 ENCOUNTER — Other Ambulatory Visit: Payer: Self-pay | Admitting: Nurse Practitioner

## 2015-06-12 ENCOUNTER — Other Ambulatory Visit: Payer: Self-pay | Admitting: Nurse Practitioner

## 2015-06-12 MED ORDER — METOPROLOL SUCCINATE ER 50 MG PO TB24: 50.0000 mg | ORAL_TABLET | Freq: Every day | ORAL | Status: DC

## 2015-06-12 NOTE — Telephone Encounter (Signed)
Done, [facility]

## 2015-06-16 ENCOUNTER — Other Ambulatory Visit: Payer: Self-pay | Admitting: Nurse Practitioner

## 2015-08-19 ENCOUNTER — Encounter: Payer: Self-pay | Admitting: Gastroenterology

## 2015-08-21 ENCOUNTER — Ambulatory Visit (INDEPENDENT_AMBULATORY_CARE_PROVIDER_SITE_OTHER): Payer: BLUE CROSS/BLUE SHIELD | Admitting: Nurse Practitioner

## 2015-08-21 ENCOUNTER — Encounter: Payer: Self-pay | Admitting: Nurse Practitioner

## 2015-08-21 VITALS — BP 147/96 | HR 78 | Temp 98.6°F | Ht 64.0 in | Wt 179.0 lb

## 2015-08-21 DIAGNOSIS — Z23 Encounter for immunization: Secondary | ICD-10-CM

## 2015-08-21 DIAGNOSIS — K591 Functional diarrhea: Secondary | ICD-10-CM

## 2015-08-21 NOTE — Progress Notes (Signed)
   Subjective:    Patient ID: Casey Drake, female    DOB: 1961/03/12, 54 y.o.   MRN: [id number]  HPI Patient in today c/o trouble with her bowel movements- she has to have a bowel  Movement soon after eating. It usually hits her all the sudden and sometimes she cannot hold it long enough to get to the bathroom. She goes 2-3 x a day. This started several months ago. She called her GI office nad they could not see her until December 22. Wants toi be seen sooner.    Review of Systems  Constitutional: Negative.   HENT: Negative.   Respiratory: Negative.   Cardiovascular: Negative.   Genitourinary: Negative.   Neurological: Negative.   Psychiatric/Behavioral: Negative.   All other systems reviewed and are negative.      Objective:   Physical Exam  Constitutional: She is oriented to person, place, and time. She appears well-developed and well-nourished.  Cardiovascular: Normal rate, regular rhythm and normal heart sounds.   Pulmonary/Chest: Effort normal and breath sounds normal.  Neurological: She is alert and oriented to person, place, and time.  Skin: Skin is warm and dry.  Psychiatric: She has a normal mood and affect. Her behavior is normal. Judgment and thought content normal.   BP 147/96 mmHg  Pulse 78  Temp(Src) 98.6 F (37 C) (Oral)  Ht 5\' 4"  (1.626 m)  Wt 179 lb (81.194 kg)  BMI 30.71 kg/m2  LMP 03/11/2014         Assessment & Plan:  1. Functional diarrhea Increase fiber in diet Imodium AD OTC as needed Keep diary of bowel movements - Ambulatory referral to Gastroenterology  [name] Hassell Done, FNP

## 2015-08-21 NOTE — Patient Instructions (Signed)

## 2015-09-05 ENCOUNTER — Other Ambulatory Visit (INDEPENDENT_AMBULATORY_CARE_PROVIDER_SITE_OTHER): Payer: BLUE CROSS/BLUE SHIELD

## 2015-09-05 ENCOUNTER — Ambulatory Visit: Payer: Self-pay | Admitting: Gastroenterology

## 2015-09-05 ENCOUNTER — Ambulatory Visit (INDEPENDENT_AMBULATORY_CARE_PROVIDER_SITE_OTHER): Payer: BLUE CROSS/BLUE SHIELD | Admitting: Gastroenterology

## 2015-09-05 ENCOUNTER — Encounter: Payer: Self-pay | Admitting: Gastroenterology

## 2015-09-05 VITALS — BP 120/80 | HR 63 | Ht 64.0 in | Wt 178.0 lb

## 2015-09-05 DIAGNOSIS — R152 Fecal urgency: Secondary | ICD-10-CM

## 2015-09-05 DIAGNOSIS — Z86010 Personal history of colonic polyps: Secondary | ICD-10-CM | POA: Insufficient documentation

## 2015-09-05 DIAGNOSIS — R195 Other fecal abnormalities: Secondary | ICD-10-CM | POA: Diagnosis not present

## 2015-09-05 DIAGNOSIS — R194 Change in bowel habit: Secondary | ICD-10-CM

## 2015-09-05 LAB — COMPREHENSIVE METABOLIC PANEL
ALBUMIN: 4.2 g/dL (ref 3.5–5.2)
ALK PHOS: 69 U/L (ref 39–117)
ALT: 24 U/L (ref 0–35)
AST: 22 U/L (ref 0–37)
BILIRUBIN TOTAL: 0.8 mg/dL (ref 0.2–1.2)
BUN: 13 mg/dL (ref 6–23)
CO2: 29 mEq/L (ref 19–32)
CREATININE: 0.74 mg/dL (ref 0.40–1.20)
Calcium: 9.8 mg/dL (ref 8.4–10.5)
Chloride: 101 mEq/L (ref 96–112)
GFR: 86.54 mL/min (ref >60.00)
GLUCOSE: 95 mg/dL (ref 70–99)
POTASSIUM: 4.5 meq/L (ref 3.5–5.1)
SODIUM: 137 meq/L (ref 135–145)
TOTAL PROTEIN: 7 g/dL (ref 6.0–8.3)

## 2015-09-05 LAB — CBC WITH DIFFERENTIAL/PLATELET
BASOS PCT: 0.3 % (ref 0.0–3.0)
Basophils Absolute: 0 10*3/uL (ref 0.0–0.1)
EOS ABS: 0.2 10*3/uL (ref 0.0–0.7)
Eosinophils Relative: 2.3 % (ref 0.0–5.0)
HEMATOCRIT: 45.3 % (ref 36.0–46.0)
Hemoglobin: 15.5 g/dL — ABNORMAL HIGH (ref 12.0–15.0)
LYMPHS PCT: 30.3 % (ref 12.0–46.0)
Lymphs Abs: 2 10*3/uL (ref 0.7–4.0)
MCHC: 34.2 g/dL (ref 30.0–36.0)
MCV: 94.8 fl (ref 78.0–100.0)
Monocytes Absolute: 0.5 10*3/uL (ref 0.1–1.0)
Monocytes Relative: 7.2 % (ref 3.0–12.0)
NEUTROS ABS: 4 10*3/uL (ref 1.4–7.7)
NEUTROS PCT: 59.9 % (ref 43.0–77.0)
PLATELETS: 194 10*3/uL (ref 150.0–400.0)
RBC: 4.78 Mil/uL (ref 3.87–5.11)
RDW: 12.3 % (ref 11.5–15.5)
WBC: 6.7 10*3/uL (ref 4.0–10.5)

## 2015-09-05 LAB — TSH: TSH: 0.85 u[IU]/mL (ref 0.35–4.50)

## 2015-09-05 LAB — IGA: IgA: 199 mg/dL (ref 68–378)

## 2015-09-05 MED ORDER — NA SULFATE-K SULFATE-MG SULF 17.5-3.13-1.6 GM/177ML PO SOLN: 1.0000 | Freq: Once | ORAL | Status: DC

## 2015-09-05 NOTE — Patient Instructions (Addendum)
You have been scheduled for a colonoscopy. Please follow written instructions given to you at your visit today.  Please pick up your prep supplies at the pharmacy within the next 1-3 days. If you use inhalers (even only as needed), please bring them with you on the day of your procedure. Your physician has requested that you go to www.startemmi.com and enter the access code given to you at your visit today. This web site gives a general overview about your procedure. However, you should still follow specific instructions given to you by our office regarding your preparation for the procedure.  Your physician has requested that you go to the basement for lab work before leaving today.  Please start a fiber supplement such as Metamucil or Benefiber.
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
## 2015-09-07 NOTE — Progress Notes (Signed)
Reviewed and agree with management plan.  Malcolm T. Stark, MD FACG 

## 2015-09-09 LAB — TISSUE TRANSGLUTAMINASE, IGA: Tissue Transglutaminase Ab, IgA: 1 U/mL (ref <4)

## 2015-09-17 ENCOUNTER — Ambulatory Visit (AMBULATORY_SURGERY_CENTER): Payer: BLUE CROSS/BLUE SHIELD | Admitting: Gastroenterology

## 2015-09-17 ENCOUNTER — Encounter: Payer: Self-pay | Admitting: Gastroenterology

## 2015-09-17 ENCOUNTER — Telehealth: Payer: Self-pay | Admitting: Gastroenterology

## 2015-09-17 VITALS — BP 133/83 | HR 65 | Temp 98.5°F | Resp 28 | Ht 64.0 in | Wt 178.0 lb

## 2015-09-17 DIAGNOSIS — D125 Benign neoplasm of sigmoid colon: Secondary | ICD-10-CM | POA: Diagnosis not present

## 2015-09-17 DIAGNOSIS — D123 Benign neoplasm of transverse colon: Secondary | ICD-10-CM

## 2015-09-17 DIAGNOSIS — K635 Polyp of colon: Secondary | ICD-10-CM

## 2015-09-17 DIAGNOSIS — Z86010 Personal history of colonic polyps: Secondary | ICD-10-CM

## 2015-09-17 DIAGNOSIS — D120 Benign neoplasm of cecum: Secondary | ICD-10-CM | POA: Diagnosis not present

## 2015-09-17 DIAGNOSIS — R194 Change in bowel habit: Secondary | ICD-10-CM | POA: Diagnosis not present

## 2015-09-17 MED ORDER — SODIUM CHLORIDE 0.9 % IV SOLN: 500.0000 mL | INTRAVENOUS | Status: DC

## 2015-09-17 MED ORDER — HYOSCYAMINE SULFATE 0.125 MG SL SUBL: 0.1250 mg | SUBLINGUAL_TABLET | SUBLINGUAL | Status: DC | PRN

## 2015-09-17 NOTE — Telephone Encounter (Signed)
Verified prescription with pharmacist per Dr. Lynne Leader procedure report from today.

## 2015-09-17 NOTE — Patient Instructions (Signed)
Discharge instructions given. Handouts on polyps and diverticulosis. Resume previous medications. YOU HAD AN ENDOSCOPIC PROCEDURE TODAY AT THE [facility]:   Refer to the procedure report that was given to you for any specific questions about what was found during the examination.  If the procedure report does not answer your questions, please call your gastroenterologist to clarify.  If you requested that your care partner not be given the details of your procedure findings, then the procedure report has been included in a sealed envelope for you to review at your convenience later.  YOU SHOULD EXPECT: Some feelings of bloating in the abdomen. Passage of more gas than usual.  Walking can help get rid of the air that was put into your GI tract during the procedure and reduce the bloating. If you had a lower endoscopy (such as a colonoscopy or flexible sigmoidoscopy) you may notice spotting of blood in your stool or on the toilet paper. If you underwent a bowel prep for your procedure, you may not have a normal bowel movement for a few days.  Please Note:  You might notice some irritation and congestion in your nose or some drainage.  This is from the oxygen used during your procedure.  There is no need for concern and it should clear up in a day or so.  SYMPTOMS TO REPORT IMMEDIATELY:   Following lower endoscopy (colonoscopy or flexible sigmoidoscopy):  Excessive amounts of blood in the stool  Significant tenderness or worsening of abdominal pains  Swelling of the abdomen that is new, acute  Fever of 100F or higher   For urgent or emergent issues, a gastroenterologist can be reached at any hour by calling [phone].   DIET: Your first meal following the procedure should be a small meal and then it is ok to progress to your normal diet. Heavy or fried foods are harder to digest and may make you feel nauseous or bloated.  Likewise, meals heavy in dairy and vegetables can  increase bloating.  Drink plenty of fluids but you should avoid alcoholic beverages for 24 hours.  ACTIVITY:  You should plan to take it easy for the rest of today and you should NOT DRIVE or use heavy machinery until tomorrow (because of the sedation medicines used during the test).    FOLLOW UP: Our staff will call the number listed on your records the next business day following your procedure to check on you and address any questions or concerns that you may have regarding the information given to you following your procedure. If we do not reach you, we will leave a message.  However, if you are feeling well and you are not experiencing any problems, there is no need to return our call.  We will assume that you have returned to your regular daily activities without incident.  If any biopsies were taken you will be contacted by phone or by letter within the next 1-3 weeks.  Please call us at [phone] if you have not heard about the biopsies in 3 weeks.    SIGNATURES/CONFIDENTIALITY: You and/or your care partner have signed paperwork which will be entered into your electronic medical record.  These signatures attest to the fact that that the information above on your After Visit Summary has been reviewed and is understood.  Full responsibility of the confidentiality of this discharge information lies with you and/or your care-partner. 

## 2015-09-17 NOTE — Progress Notes (Signed)
Called to room to assist during endoscopic procedure.  Patient ID and intended procedure confirmed with present staff. Received instructions for my participation in the procedure from the performing physician.  

## 2015-09-17 NOTE — Op Note (Signed)
[employer]  [address] [place], [address]   COLONOSCOPY PROCEDURE REPORT  PATIENT: Casey Drake, Casey Drake  MR#: [id number] BIRTHDATE: [birth year] , 23  yrs. old GENDER: female ENDOSCOPIST: Ladene Artist, MD, [facility] PROCEDURE DATE:  09/17/2015 PROCEDURE:   Colonoscopy, diagnostic, Colonoscopy with biopsy, and Colonoscopy with snare polypectomy First Screening Colonoscopy - Avg.  risk and is 50 yrs.  old or older - No.  Prior Negative Screening - Now for repeat screening. N/A  History of Adenoma - Now for follow-up colonoscopy & has been > or = to 3 yrs.  Yes hx of adenoma.  Has been 3 or more years since last colonoscopy.  Polyps removed today? Yes ASA CLASS:   Class II INDICATIONS:Surveillance due to prior colonic neoplasia, PH Colon Adenoma, and change in bowel habits. MEDICATIONS: Monitored anesthesia care and Propofol 300 mg IV DESCRIPTION OF PROCEDURE:   After the risks benefits and alternatives of the procedure were thoroughly explained, informed consent was obtained.  The digital rectal exam revealed no abnormalities of the rectum.   The LB [id number]-H190 [id number]  endoscope was introduced through the anus and advanced to the cecum, which was identified by both the appendix and ileocecal valve. No adverse events experienced.   The quality of the prep was good.  (Suprep was used)  The instrument was then slowly withdrawn as the colon was fully examined. Estimated blood loss is zero unless otherwise noted in this procedure report.  COLON FINDINGS: Three sessile polyps measuring 6 mm in size were found in the sigmoid colon and transverse colon.  Polypectomies were performed with a cold snare.  The resection was complete, the polyp tissue was completely retrieved and sent to histology.   Two sessile polyps measuring 4 mm in size were found at the cecum. Polypectomies were performed with cold forceps.  The resection was complete, the polyp tissue was completely retrieved  and sent to histology.   There was mild diverticulosis noted in the sigmoid colon and transverse colon.   The examination was otherwise normal. Retroflexed views revealed no abnormalities. The time to cecum = 2.1 Withdrawal time = 12.4   The scope was withdrawn and the procedure completed. COMPLICATIONS: There were no immediate complications.  ENDOSCOPIC IMPRESSION: 1.   Three sessile polyps in the sigmoid colon and transverse colon; polypectomies performed with a cold snare 2.   Two sessile polyps were found at the cecum; polypectomies performed with cold forceps 3.   Mild diverticulosis in the sigmoid colon and transverse colon  RECOMMENDATIONS: 1.  Await pathology results 2.  High fiber diet with liberal fluid intake. 3.  Repeat Colonoscopy in 5 years 4.  Align 1 PO daily and Levsin 1-2 PO ac as needed, #100, 5 refills   eSigned:  Ladene Artist, MD, [facility] 09/17/2015 3:45 PM

## 2015-09-17 NOTE — Progress Notes (Signed)
A/ox3 pleased with MAC, report to Celia RN 

## 2015-09-18 ENCOUNTER — Telehealth: Payer: Self-pay | Admitting: Emergency Medicine

## 2015-09-18 NOTE — Telephone Encounter (Signed)
  Follow up Call-  Call back number 09/17/2015  Post procedure Call Back phone  # [phone]  Permission to leave phone message Yes     Patient questions:  Do you have a fever, pain , or abdominal swelling? No. Pain Score  0 *  Have you tolerated food without any problems? Yes.    Have you been able to return to your normal activities? Yes.    Do you have any questions about your discharge instructions: Diet   No. Medications  No. Follow up visit  No.  Do you have questions or concerns about your Care? No.  Actions: * If pain score is 4 or above: No action needed, pain <4.

## 2015-09-30 ENCOUNTER — Encounter: Payer: Self-pay | Admitting: Gastroenterology

## 2015-10-14 ENCOUNTER — Other Ambulatory Visit: Payer: Self-pay | Admitting: Nurse Practitioner

## 2015-10-14 ENCOUNTER — Telehealth: Payer: Self-pay | Admitting: Nurse Practitioner

## 2015-10-14 NOTE — Telephone Encounter (Signed)
appt made for Wednesday with dr Sabra Heck. She decided not to wait.

## 2015-10-15 ENCOUNTER — Ambulatory Visit (INDEPENDENT_AMBULATORY_CARE_PROVIDER_SITE_OTHER): Payer: BLUE CROSS/BLUE SHIELD | Admitting: Family Medicine

## 2015-10-15 ENCOUNTER — Encounter: Payer: Self-pay | Admitting: Family Medicine

## 2015-10-15 VITALS — BP 128/79 | HR 67 | Temp 96.6°F | Ht 64.0 in | Wt 176.1 lb

## 2015-10-15 DIAGNOSIS — R5383 Other fatigue: Secondary | ICD-10-CM | POA: Diagnosis not present

## 2015-10-15 DIAGNOSIS — R52 Pain, unspecified: Secondary | ICD-10-CM

## 2015-10-15 DIAGNOSIS — M79629 Pain in unspecified upper arm: Secondary | ICD-10-CM

## 2015-10-15 DIAGNOSIS — M25529 Pain in unspecified elbow: Secondary | ICD-10-CM
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
## 2015-10-16 LAB — ANA COMPREHENSIVE PANEL
Anti JO-1: <0.2 AI (ref 0.0–0.9)
Centromere Ab Screen: <0.2 AI (ref 0.0–0.9)
Chromatin Ab SerPl-aCnc: <0.2 AI (ref 0.0–0.9)
ENA SSA (RO) Ab: <0.2 AI (ref 0.0–0.9)
ENA SSB (LA) Ab: <0.2 AI (ref 0.0–0.9)
Scleroderma SCL-70: <0.2 AI (ref 0.0–0.9)

## 2015-10-16 LAB — CMP14+EGFR
A/G RATIO: 1.9 (ref 1.1–2.5)
ALK PHOS: 72 IU/L (ref 39–117)
ALT: 32 IU/L (ref 0–32)
AST: 25 IU/L (ref 0–40)
Albumin: 4.5 g/dL (ref 3.5–5.5)
BILIRUBIN TOTAL: 0.6 mg/dL (ref 0.0–1.2)
BUN/Creatinine Ratio: 14 (ref 9–23)
BUN: 10 mg/dL (ref 6–24)
CHLORIDE: 98 mmol/L (ref 96–106)
CO2: 22 mmol/L (ref 18–29)
Calcium: 10 mg/dL (ref 8.7–10.2)
Creatinine, Ser: 0.69 mg/dL (ref 0.57–1.00)
GFR calc Af Amer: 113 mL/min/{1.73_m2} (ref >59)
GFR, EST NON AFRICAN AMERICAN: 98 mL/min/{1.73_m2} (ref >59)
GLOBULIN, TOTAL: 2.4 g/dL (ref 1.5–4.5)
Glucose: 90 mg/dL (ref 65–99)
POTASSIUM: 4.5 mmol/L (ref 3.5–5.2)
SODIUM: 138 mmol/L (ref 134–144)
Total Protein: 6.9 g/dL (ref 6.0–8.5)

## 2015-10-16 LAB — SEDIMENTATION RATE: SED RATE: 8 mm/h (ref 0–40)

## 2015-10-16 LAB — RHEUMATOID FACTOR

## 2015-10-23 ENCOUNTER — Ambulatory Visit: Payer: Self-pay | Admitting: Gastroenterology

## 2015-10-23 ENCOUNTER — Ambulatory Visit (INDEPENDENT_AMBULATORY_CARE_PROVIDER_SITE_OTHER): Payer: BLUE CROSS/BLUE SHIELD | Admitting: Family Medicine

## 2015-10-23 ENCOUNTER — Encounter: Payer: Self-pay | Admitting: Family Medicine

## 2015-10-23 VITALS — BP 112/78 | HR 88 | Temp 96.6°F | Ht 64.0 in | Wt 178.0 lb

## 2015-10-23 DIAGNOSIS — M797 Fibromyalgia: Secondary | ICD-10-CM | POA: Diagnosis not present

## 2015-10-23 MED ORDER — AMITRIPTYLINE HCL 10 MG PO TABS: 10.0000 mg | ORAL_TABLET | Freq: Every day | ORAL | Status: DC

## 2015-10-23 NOTE — Progress Notes (Signed)
   Subjective:    Patient ID: Casey Drake, female    DOB: 12-16-1960, 54 y.o.   MRN: [id number]  HPI 54 year old female with fatigue arthralgias and myalgias. At her last visit she had some blood work including sedimentation rate ANA panel metabolic panel all of which proved normal. That leaves [place] with the possible diagnosis of fibromyalgia. We have discussed this previously. She had also been on citalopram for a long time for some depression.    Review of Systems  Constitutional: Positive for appetite change.  Respiratory: Negative.   Cardiovascular: Negative.   Musculoskeletal: Positive for myalgias.  Psychiatric/Behavioral: Negative.       BP 112/78 mmHg  Pulse 88  Temp(Src) 96.6 F (35.9 C) (Oral)  Ht 5\' 4"  (1.626 m)  Wt 178 lb (80.74 kg)  BMI 30.54 kg/m2  LMP 03/11/2014  Objective:   Physical Exam  Constitutional: She is oriented to person, place, and time. She appears well-developed and well-nourished.  There are palpable areas of soreness and tenderness in the neck trapezius shoulders hips knees or arms that are consistent I think with diagnosis of fibromyalgia.  Cardiovascular: Normal rate, regular rhythm and normal heart sounds.   Pulmonary/Chest: Effort normal and breath sounds normal.  Neurological: She is alert and oriented to person, place, and time.          Assessment & Plan:  1. Fibromyalgia This is a diagnosis of exclusion. We talked about some of her options regarding treatment. I suggested that she try to get some regular exercise such as walking as a starting point. I will begin her on amitriptyline 10 mg to take 1 hour before bedtime. Other options might include Cymbalta or Lyrica depending on her response to amitriptyline. She will taper off citalopram as she starts amitriptyline Recheck in one month or call sooner as needed  Wardell Honour MD

## 2015-11-21 ENCOUNTER — Other Ambulatory Visit: Payer: Self-pay | Admitting: Physician Assistant

## 2015-11-27 ENCOUNTER — Ambulatory Visit: Payer: BLUE CROSS/BLUE SHIELD | Attending: Podiatry | Admitting: Physical Therapy

## 2015-11-27 DIAGNOSIS — R5381 Other malaise: Secondary | ICD-10-CM | POA: Insufficient documentation

## 2015-11-27 DIAGNOSIS — M79672 Pain in left foot: Secondary | ICD-10-CM | POA: Diagnosis present
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
## 2015-12-02 ENCOUNTER — Encounter: Payer: Self-pay | Admitting: Physical Therapy

## 2015-12-02 ENCOUNTER — Ambulatory Visit: Payer: BLUE CROSS/BLUE SHIELD | Admitting: Physical Therapy

## 2015-12-02 DIAGNOSIS — M79672 Pain in left foot: Secondary | ICD-10-CM | POA: Diagnosis not present

## 2015-12-02 DIAGNOSIS — R5381 Other malaise: Secondary | ICD-10-CM
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
## 2015-12-10 ENCOUNTER — Encounter: Payer: BLUE CROSS/BLUE SHIELD | Admitting: Physical Therapy

## 2016-01-05 ENCOUNTER — Other Ambulatory Visit: Payer: Self-pay | Admitting: Nurse Practitioner

## 2016-01-16 ENCOUNTER — Other Ambulatory Visit: Payer: Self-pay | Admitting: Nurse Practitioner

## 2016-01-16 ENCOUNTER — Other Ambulatory Visit: Payer: Self-pay | Admitting: Family Medicine

## 2016-04-09 ENCOUNTER — Other Ambulatory Visit: Payer: Self-pay | Admitting: Family Medicine

## 2016-04-09 NOTE — Telephone Encounter (Signed)
Last seen 08/21/15 Last filled 01/05/16

## 2016-04-19 ENCOUNTER — Other Ambulatory Visit: Payer: Self-pay | Admitting: Nurse Practitioner

## 2016-04-20 NOTE — Telephone Encounter (Signed)
Last seen 10/23/15 Dr Sabra Heck

## 2016-04-26 ENCOUNTER — Ambulatory Visit (INDEPENDENT_AMBULATORY_CARE_PROVIDER_SITE_OTHER): Payer: BLUE CROSS/BLUE SHIELD | Admitting: Family Medicine

## 2016-04-26 ENCOUNTER — Encounter: Payer: Self-pay | Admitting: Family Medicine

## 2016-04-26 VITALS — BP 120/75 | HR 69 | Temp 97.2°F | Ht 64.0 in | Wt 173.0 lb

## 2016-04-26 DIAGNOSIS — N898 Other specified noninflammatory disorders of vagina: Secondary | ICD-10-CM

## 2016-04-26 DIAGNOSIS — L298 Other pruritus: Secondary | ICD-10-CM | POA: Diagnosis not present

## 2016-04-26 DIAGNOSIS — F32A Depression, unspecified: Secondary | ICD-10-CM

## 2016-04-26 DIAGNOSIS — F329 Major depressive disorder, single episode, unspecified: Secondary | ICD-10-CM | POA: Diagnosis not present

## 2016-04-26 DIAGNOSIS — M797 Fibromyalgia: Secondary | ICD-10-CM

## 2016-04-26 LAB — WET PREP FOR TRICH, YEAST, CLUE
CLUE CELL EXAM: POSITIVE — AB
Trichomonas Exam: NEGATIVE

## 2016-04-26 MED ORDER — METRONIDAZOLE 500 MG PO TABS: ORAL_TABLET | ORAL | Status: DC

## 2016-04-26 MED ORDER — PREGABALIN 75 MG PO CAPS: ORAL_CAPSULE | ORAL | Status: DC

## 2016-04-26 MED ORDER — DULOXETINE HCL 30 MG PO CPEP: 30.0000 mg | ORAL_CAPSULE | Freq: Every day | ORAL | Status: DC

## 2016-04-26 MED ORDER — FLUCONAZOLE 150 MG PO TABS: 150.0000 mg | ORAL_TABLET | Freq: Once | ORAL | Status: DC

## 2016-04-26 NOTE — Addendum Note (Signed)
Addended by: Zannie Cove on: 04/26/2016 01:46 PM   Modules accepted: Orders

## 2016-04-26 NOTE — Progress Notes (Signed)
Subjective:    Patient ID: Casey Drake, female    DOB: Feb 09, 1961, 55 y.o.   MRN: [id number]  HPI Patient here today to discuss fibromyalgia. Patient continues to have backaches as well as pain in muscles of her arms and her wrists and elbows. There are no lower extremity symptoms. She had stopped amitriptyline because it made her feel "weird.". She also is complaining of some vaginal irritation and mostly itching but minimal discharge. This is a new symptom. There is no recent antibiotic usage.    Patient Active Problem List   Diagnosis Date Noted  . Change in bowel habits 09/05/2015  . Loose stools 09/05/2015  . Fecal urgency 09/05/2015  . History of colonic polyps 09/05/2015  . Right hip pain 11/15/2013  . Hyperlipidemia 04/04/2013  . GAD (generalized anxiety disorder) 04/04/2013  . Depression 04/04/2013   Outpatient Encounter Prescriptions as of 04/26/2016  Medication Sig  . Calcium Carbonate-Vitamin D (CALCIUM-VITAMIN D) 500-200 MG-UNIT tablet Take 1 tablet by mouth daily.  . citalopram (CELEXA) 20 MG tablet TAKE 1 TABLET BY MOUTH EVERY DAY  . fish oil-omega-3 fatty acids 1000 MG capsule Take 1 g by mouth daily.    Marland Kitchen HYDROcodone-acetaminophen (NORCO) 10-325 MG per tablet Take 1 tablet by mouth every 6 (six) hours as needed.    . metoprolol succinate (TOPROL-XL) 50 MG 24 hr tablet TAKE 1 TABLET BY MOUTH EVERY DAY WTIH OR IMMEDIATELY FOLLOWING A MEAL  . traZODone (DESYREL) 50 MG tablet TAKE ONE TABLET BY MOUTH AT BEDTIME.  . hyoscyamine (LEVSIN SL) 0.125 MG SL tablet Take 1 tablet (0.125 mg total) by mouth every 4 (four) hours as needed. Levsin 1-2 PO before meals as needed. (Patient not taking: Reported on 04/26/2016)  . [DISCONTINUED] amitriptyline (ELAVIL) 10 MG tablet TAKE 1 TABLET (10 MG TOTAL) BY MOUTH AT BEDTIME.  . [DISCONTINUED] diclofenac (VOLTAREN) 25 MG EC tablet Take 25 mg by mouth 2 (two) times daily.   No facility-administered encounter medications on file as of  04/26/2016.      Review of Systems  Constitutional: Negative.   HENT: Negative.   Eyes: Negative.   Respiratory: Negative.   Cardiovascular: Negative.   Gastrointestinal: Negative.   Endocrine: Negative.   Genitourinary: Negative.   Musculoskeletal: Positive for myalgias (waist and up, ribs and arms).  Skin: Negative.   Allergic/Immunologic: Negative.   Neurological: Negative.   Hematological: Negative.   Psychiatric/Behavioral: Negative.        Objective:   Physical Exam  Constitutional: She is oriented to person, place, and time. She appears well-developed and well-nourished.  Cardiovascular: Normal rate, regular rhythm and normal heart sounds.   Pulmonary/Chest: Effort normal and breath sounds normal.  Musculoskeletal:  There is tenderness to palpation between the scapula as well as on compression of her biceps and forearms.  Neurological: She is alert and oriented to person, place, and time.  Psychiatric: She has a normal mood and affect.   BP 120/75 mmHg  Pulse 69  Temp(Src) 97.2 F (36.2 C) (Oral)  Ht 5\' 4"  (1.626 m)  Wt 173 lb (78.472 kg)  BMI 29.68 kg/m2  LMP 03/11/2014        Assessment & Plan:  1. Vagina itching Wet prep shows many yeast but also shows some clue cells consistent with bacterial vaginosis. Will treat with Diflucan 150 mg as one-time dose and also metronidazole 2 g as one-time dose - WET PREP FOR [place], YEAST, CLUE  2. Depression Shin feels citalopram is effective. Continue  as same  3. Fibromyalgia Will try Lyrica for symptom management beginning with low dose and titrate  Wardell Honour MD

## 2016-05-18 ENCOUNTER — Encounter: Payer: Self-pay | Admitting: Family Medicine

## 2016-05-18 ENCOUNTER — Ambulatory Visit (INDEPENDENT_AMBULATORY_CARE_PROVIDER_SITE_OTHER): Payer: BLUE CROSS/BLUE SHIELD

## 2016-05-18 ENCOUNTER — Ambulatory Visit (INDEPENDENT_AMBULATORY_CARE_PROVIDER_SITE_OTHER): Payer: BLUE CROSS/BLUE SHIELD | Admitting: Family Medicine

## 2016-05-18 VITALS — BP 124/78 | HR 63 | Temp 96.8°F | Ht 64.0 in | Wt 169.4 lb

## 2016-05-18 DIAGNOSIS — M546 Pain in thoracic spine: Secondary | ICD-10-CM

## 2016-05-18 MED ORDER — GABAPENTIN 100 MG PO CAPS: 100.0000 mg | ORAL_CAPSULE | Freq: Three times a day (TID) | ORAL | Status: DC

## 2016-05-18 NOTE — Patient Instructions (Signed)
Great to meet you!  Start gabapentin 1 pill every night for 1 week, if you are tolerating it then add 1 pill in the morning for 1 week. You can then take 1 pill three times a day.   We will slowly increase the dose to hopefully have something effective but not sedating.   Come back to see Dr. Sabra Heck in 3 weeks.

## 2016-05-18 NOTE — Progress Notes (Signed)
   HPI  Patient presents today here with right-sided thoracic back pain.  Patient's lines that she has chronic back pain managed by orthopedic surgeon with hydrocodone. This is very different than that.  She describes right-sided thoracic/rib cage pain radiating to her bilateral shoulder area. No injury Has recent diagnosis of fibromyalgia, did not tolerate Cymbalta, unsure if this is related. Has not tried over-the-counter medication. No fever, chills, sweats, or history of cancer.   PMH: Smoking status noted ROS: Per HPI  Objective: BP 124/78 mmHg  Pulse 63  Temp(Src) 96.8 F (36 C) (Oral)  Ht 5\' 4"  (1.626 m)  Wt 169 lb 6.4 oz (76.839 kg)  BMI 29.06 kg/m2  LMP 03/11/2014 Gen: NAD, alert, cooperative with exam HEENT: NCAT CV: RRR, good S1/S2, no murmur Resp: CTABL, no wheezes, non-labored Ext: No edema, warm Neuro: Alert and oriented, No gross deficits  MSK: Tenderness to palpation of the muscles around her bilateral shoulder blades No tenderness to palpation of midline spine in the thoracic area  Plain film of the thoracic spine and chest without any acute findings  Assessment and plan:  # Thoracic back pain Unclear etiology, likely musculoskeletal With history of fibromyalgia and gradually worsening over the last 4-6 weeks will start gabapentin for treatment I given her 4 additional days out of work I discussed the unlikelihood of further days out of work Try 100 mg of gabapentin once daily this week, then twice daily week following, then 3 times daily the week after that. She had drowsiness with Cymbalta which she did not tolerate Then follow-up with PCP in 3 weeks.     Orders Placed This Encounter  Procedures  . DG Thoracic Spine 2 View    Standing Status: Future     Number of Occurrences:      Standing Expiration Date: 07/18/2017    Order Specific Question:  Reason for Exam (SYMPTOM  OR DIAGNOSIS REQUIRED)    Answer:  R sided thoracic back pain- 1 month     Order Specific Question:  Is the patient pregnant?    Answer:  No    Order Specific Question:  Preferred imaging location?    Answer:  External  . DG Chest 2 View    Standing Status: Future     Number of Occurrences:      Standing Expiration Date: 07/19/2017    Order Specific Question:  Reason for Exam (SYMPTOM  OR DIAGNOSIS REQUIRED)    Answer:  R sided thoracic back pain, rule out pulm source    Order Specific Question:  Is the patient pregnant?    Answer:  No    Order Specific Question:  Preferred imaging location?    Answer:  External    Meds ordered this encounter  Medications  . gabapentin (NEURONTIN) 100 MG capsule    Sig: Take 1 capsule (100 mg total) by mouth 3 (three) times daily.    Dispense:  90 capsule    Refill:  , MD [facility] 05/18/2016, 9:05 AM

## 2016-06-10 ENCOUNTER — Other Ambulatory Visit: Payer: Self-pay | Admitting: Family Medicine

## 2016-07-08 ENCOUNTER — Other Ambulatory Visit: Payer: Self-pay | Admitting: Nurse Practitioner

## 2016-07-09 ENCOUNTER — Other Ambulatory Visit: Payer: Self-pay | Admitting: Family Medicine

## 2016-07-19 ENCOUNTER — Encounter: Payer: Self-pay | Admitting: Nurse Practitioner

## 2016-07-19 ENCOUNTER — Ambulatory Visit (INDEPENDENT_AMBULATORY_CARE_PROVIDER_SITE_OTHER): Payer: BLUE CROSS/BLUE SHIELD | Admitting: Nurse Practitioner

## 2016-07-19 VITALS — BP 134/80 | HR 69 | Temp 96.3°F | Ht 64.0 in | Wt 174.0 lb

## 2016-07-19 DIAGNOSIS — M545 Low back pain, unspecified: Secondary | ICD-10-CM

## 2016-07-19 MED ORDER — METHYLPREDNISOLONE ACETATE 80 MG/ML IJ SUSP
80.0000 mg | Freq: Once | INTRAMUSCULAR | Status: AC
  Administered 2016-07-19: 80 mg via INTRAMUSCULAR

## 2016-07-19 NOTE — Progress Notes (Signed)
   Subjective:    Patient ID: Casey Drake, female    DOB: 1961/01/31, 55 y.o.   MRN: [id number]  HPI  Patient comes in c/o low back pain- she is scheduled for injection in back on September 29,2017- sugar and water solution. SHe has had back pain for several years and the injections Dr. Nelva Bush gives her really helps. Her current pain sh e rates 8/10 and is constant. Would like something to help her until she sees Dr. Nelva Bush.   Review of Systems  Constitutional: Negative.   HENT: Negative.   Respiratory: Negative.   Cardiovascular: Negative.   Gastrointestinal: Negative.   Genitourinary: Negative.   Musculoskeletal: Negative.   Neurological: Negative.   Psychiatric/Behavioral: Negative.   All other systems reviewed and are negative.      Objective:   Physical Exam  Constitutional: She is oriented to person, place, and time. She appears well-developed and well-nourished.  Cardiovascular: Normal rate, regular rhythm and normal heart sounds.   Pulmonary/Chest: Effort normal and breath sounds normal.  Musculoskeletal:  FROM of lumbar spine with pain on flexion (+) SLR on right at 90 degrees. Motor strength and sensation distally intact  Neurological: She is alert and oriented to person, place, and time.  Skin: Skin is warm.  Psychiatric: She has a normal mood and affect. Her behavior is normal. Judgment and thought content normal.   BP 134/80   Pulse 69   Temp (!) 96.3 F (35.7 C) (Oral)   Ht 5\' 4"  (1.626 m)   Wt 174 lb (78.9 kg)   LMP 03/11/2014   BMI 29.87 kg/m       Assessment & Plan:  1. Right-sided low back pain without sciatica Keep follow up appointment with Dr. Nelva Bush Moist heat  - methylPREDNISolone acetate (DEPO-MEDROL) injection 80 mg; Inject 1 mL (80 mg total) into the muscle once.  [name] Hassell Done, FNP

## 2016-07-19 NOTE — Patient Instructions (Signed)

## 2016-08-30 ENCOUNTER — Other Ambulatory Visit: Payer: Self-pay | Admitting: Family Medicine

## 2016-10-15 ENCOUNTER — Other Ambulatory Visit: Payer: Self-pay | Admitting: Family Medicine

## 2016-11-02 ENCOUNTER — Telehealth: Payer: Self-pay | Admitting: Family Medicine

## 2016-12-08 ENCOUNTER — Other Ambulatory Visit: Payer: Self-pay | Admitting: Nurse Practitioner

## 2016-12-09 ENCOUNTER — Other Ambulatory Visit: Payer: Self-pay | Admitting: *Deleted

## 2016-12-10 MED ORDER — TRAZODONE HCL 50 MG PO TABS: 50.0000 mg | ORAL_TABLET | Freq: Every day | ORAL | 0 refills | Status: DC

## 2016-12-13 ENCOUNTER — Telehealth: Payer: Self-pay | Admitting: Family Medicine

## 2016-12-13 MED ORDER — GABAPENTIN 100 MG PO CAPS: 100.0000 mg | ORAL_CAPSULE | Freq: Three times a day (TID) | ORAL | 1 refills | Status: DC

## 2016-12-13 NOTE — Telephone Encounter (Signed)
I sent in the requested prescription 

## 2016-12-14 ENCOUNTER — Other Ambulatory Visit: Payer: Self-pay

## 2016-12-14 MED ORDER — CITALOPRAM HYDROBROMIDE 20 MG PO TABS: 20.0000 mg | ORAL_TABLET | Freq: Every day | ORAL | 0 refills | Status: DC

## 2016-12-15 ENCOUNTER — Other Ambulatory Visit: Payer: Self-pay | Admitting: *Deleted

## 2017-03-07 ENCOUNTER — Other Ambulatory Visit: Payer: Self-pay | Admitting: Nurse Practitioner

## 2017-03-10 NOTE — Telephone Encounter (Signed)
Last refill without being seen 

## 2017-03-10 NOTE — Telephone Encounter (Signed)
Aware. 

## 2017-04-04 ENCOUNTER — Other Ambulatory Visit: Payer: Self-pay | Admitting: Pediatrics

## 2017-05-06 ENCOUNTER — Other Ambulatory Visit: Payer: Self-pay | Admitting: Nurse Practitioner

## 2017-05-11 ENCOUNTER — Ambulatory Visit: Payer: BLUE CROSS/BLUE SHIELD

## 2017-05-24 ENCOUNTER — Encounter: Payer: Self-pay | Admitting: Nurse Practitioner

## 2017-05-24 ENCOUNTER — Ambulatory Visit (INDEPENDENT_AMBULATORY_CARE_PROVIDER_SITE_OTHER): Payer: BLUE CROSS/BLUE SHIELD | Admitting: Nurse Practitioner

## 2017-05-24 VITALS — BP 121/85 | HR 67 | Temp 97.2°F | Ht 64.0 in | Wt 180.0 lb

## 2017-05-24 DIAGNOSIS — H01133 Eczematous dermatitis of right eye, unspecified eyelid: Secondary | ICD-10-CM | POA: Diagnosis not present

## 2017-05-24 DIAGNOSIS — B001 Herpesviral vesicular dermatitis: Secondary | ICD-10-CM

## 2017-05-24 MED ORDER — VALACYCLOVIR HCL 1 G PO TABS: 1000.0000 mg | ORAL_TABLET | Freq: Two times a day (BID) | ORAL | 2 refills | Status: DC

## 2017-05-24 MED ORDER — CRISABOROLE 2 % EX OINT: 1.0000 "application " | TOPICAL_OINTMENT | Freq: Every day | CUTANEOUS | 0 refills | Status: DC

## 2017-05-24 NOTE — Patient Instructions (Signed)
Cold Sore A cold sore, also called a fever blister, is a skin infection that is caused by a virus. This infection causes small, fluid-filled sores to form inside of the mouth or on the lips, gums, nose, chin, or cheeks. Cold sores can spread to other parts of the body, such as the eyes or fingers. Cold sores can be spread or passed from person to person (contagious) until the sores crust over completely. Cold sores can be spread through close contact, such as kissing or sharing a drinking glass. Follow these instructions at home: Medicines  Take or apply over-the-counter and prescription medicines only as told by your doctor.  Use a cotton-tip swab to apply creams or gels to your sores. Sore Care  Do not touch the sores or pick the scabs.  Wash your hands often. Do not touch your eyes without washing your hands first.  Keep the sores clean and dry.  If directed, apply ice to the sores:  Put ice in a plastic bag.  Place a towel between your skin and the bag.  Leave the ice on for 20 minutes, 2-3 times per day. Lifestyle  Do not kiss, have oral sex, or share personal items until your sores heal.  Eat a soft, bland diet. Avoid eating hot, cold, or salty foods. These can hurt your mouth.  Use a straw if it hurts to drink out of a glass.  Avoid the sun and limit your stress if these things trigger outbreaks. If sun causes cold sores, apply sunscreen on your lips before being out in the sun. Contact a doctor if:  You have symptoms for more than two weeks.  You have pus coming from the sores.  You have redness that is spreading.  You have pain or irritation in your eye.  You get sores on your genitals.  Your sores do not heal within two weeks.  You get cold sores often. Get help right away if:  You have a fever and your symptoms suddenly get worse.  You have a headache and confusion. This information is not intended to replace advice given to you by your health care  provider. Make sure you discuss any questions you have with your health care provider. Document Released: 04/18/2012 Document Revised: 03/25/2016 Document Reviewed: 08/08/2015 Elsevier Interactive Patient Education  2018 Elsevier Inc.  

## 2017-05-24 NOTE — Progress Notes (Signed)
   Subjective:    Patient ID: Casey Drake, female    DOB: 1961-01-14, 56 y.o.   MRN: [id number]  HPI Patient comes in today wih 2 complaints: - scaley itchy rash on bil upper eyelids- started about 2-3 weeks ago- itches real bad- denies any new soaps or detergents. - fever blisters- usually gets if she is out in the sun. Has had several episodes in the last month.    Review of Systems  Constitutional: Negative.   Eyes: Positive for itching (just lids). Negative for pain, redness and visual disturbance.  Respiratory: Negative.   Cardiovascular: Negative.   Gastrointestinal: Negative.   Neurological: Negative.   Psychiatric/Behavioral: Negative.   All other systems reviewed and are negative.      Objective:   Physical Exam  Constitutional: She is oriented to person, place, and time. She appears well-developed and well-nourished. No distress.  Cardiovascular: Normal rate.   Pulmonary/Chest: Effort normal and breath sounds normal.  Neurological: She is alert and oriented to person, place, and time.  Skin: Skin is warm.  Dry mild erythematous rash to bil upper lids  Psychiatric: She has a normal mood and affect. Her behavior is normal. Judgment and thought content normal.   BP 121/85   Pulse 67   Temp (!) 97.2 F (36.2 C) (Oral)   Ht 5\' 4"  (1.626 m)   Wt 180 lb (81.6 kg)   LMP 03/11/2014   BMI 30.90 kg/m         Assessment & Plan:  1. Fever blister Sun screen  To lips when in sun Do not paic at areas - valACYclovir (VALTREX) 1000 MG tablet; Take 1 tablet (1,000 mg total) by mouth 2 (two) times daily. One time at fever blister onset  Dispense: 12 tablet; Refill: 2  2. Atopic dermatitis of right eyelid Use cetaphil only to clean face No eyeshadows with glitter Avoid scratching - Crisaborole (EUCRISA) 2 % OINT; Apply 1 application topically daily.  Dispense: 5 g; Refill: 0  [name] Hassell Done, FNP

## 2017-06-10 LAB — HM MAMMOGRAPHY

## 2017-06-11 ENCOUNTER — Other Ambulatory Visit: Payer: Self-pay | Admitting: Nurse Practitioner

## 2017-06-13 ENCOUNTER — Other Ambulatory Visit: Payer: Self-pay | Admitting: Nurse Practitioner

## 2017-06-14 ENCOUNTER — Telehealth: Payer: Self-pay | Admitting: Nurse Practitioner

## 2017-06-14 DIAGNOSIS — H01133 Eczematous dermatitis of right eye, unspecified eyelid: Secondary | ICD-10-CM

## 2017-06-14 MED ORDER — CRISABOROLE 2 % EX OINT: 1.0000 "application " | TOPICAL_OINTMENT | Freq: Every day | CUTANEOUS | 0 refills | Status: DC

## 2017-06-14 NOTE — Telephone Encounter (Signed)
Please review and advise.

## 2017-07-12 ENCOUNTER — Other Ambulatory Visit: Payer: Self-pay | Admitting: Nurse Practitioner

## 2017-09-08 ENCOUNTER — Other Ambulatory Visit: Payer: Self-pay | Admitting: Nurse Practitioner

## 2017-09-12 ENCOUNTER — Other Ambulatory Visit: Payer: Self-pay | Admitting: Nurse Practitioner

## 2017-10-09 ENCOUNTER — Other Ambulatory Visit: Payer: Self-pay | Admitting: Nurse Practitioner

## 2017-11-09 ENCOUNTER — Ambulatory Visit: Payer: BLUE CROSS/BLUE SHIELD | Admitting: Family Medicine

## 2017-11-16 ENCOUNTER — Encounter: Payer: Self-pay | Admitting: Family

## 2017-11-16 ENCOUNTER — Ambulatory Visit: Payer: BLUE CROSS/BLUE SHIELD | Admitting: Family

## 2017-11-16 VITALS — BP 113/77 | HR 71 | Temp 97.5°F | Ht 64.0 in | Wt 178.4 lb

## 2017-11-16 DIAGNOSIS — J029 Acute pharyngitis, unspecified: Secondary | ICD-10-CM | POA: Diagnosis not present

## 2017-11-16 DIAGNOSIS — J020 Streptococcal pharyngitis: Secondary | ICD-10-CM | POA: Diagnosis not present

## 2017-11-16 LAB — RAPID STREP SCREEN (MED CTR MEBANE ONLY): STREP GP A AG, IA W/REFLEX: POSITIVE — AB

## 2017-11-16 MED ORDER — AMOXICILLIN 500 MG PO CAPS: 500.0000 mg | ORAL_CAPSULE | Freq: Two times a day (BID) | ORAL | 0 refills | Status: DC

## 2017-11-16 NOTE — Progress Notes (Signed)
   Subjective:    Patient ID: Casey Drake, female    DOB: 04-30-1961, 57 y.o.   MRN: [id number]  Sore Throat   This is a new problem. The current episode started yesterday. The problem has been unchanged. The maximum temperature recorded prior to her arrival was 100.4 - 100.9 F. The pain is at a severity of 8/10. The pain is moderate. Associated symptoms include ear pain, headaches, a hoarse voice and trouble swallowing. Pertinent negatives include no congestion or coughing. She has tried gargles and acetaminophen for the symptoms. The treatment provided mild relief.      Review of Systems  HENT: Positive for ear pain, hoarse voice and trouble swallowing. Negative for congestion.   Respiratory: Negative for cough.   Neurological: Positive for headaches.  All other systems reviewed and are negative.      Objective:   Physical Exam  Constitutional: She is oriented to person, place, and time. She appears well-developed and well-nourished. No distress.  HENT:  Head: Normocephalic and atraumatic.  Right Ear: External ear normal.  Left Ear: External ear normal.  Nose: Mucosal edema and rhinorrhea present.  Mouth/Throat: Posterior oropharyngeal edema and posterior oropharyngeal erythema present.  Eyes: Pupils are equal, round, and reactive to light.  Neck: Normal range of motion. Neck supple. No thyromegaly present.  Cardiovascular: Normal rate, regular rhythm, normal heart sounds and intact distal pulses.  No murmur heard. Pulmonary/Chest: Effort normal and breath sounds normal. No respiratory distress. She has no wheezes.  Abdominal: Soft. Bowel sounds are normal. She exhibits no distension. There is no tenderness.  Musculoskeletal: Normal range of motion. She exhibits no edema or tenderness.  Neurological: She is alert and oriented to person, place, and time.  Skin: Skin is warm and dry.  Psychiatric: She has a normal mood and affect. Her behavior is normal. Judgment and thought  content normal.  Vitals reviewed.     BP 113/77   Pulse 71   Temp (!) 97.5 F (36.4 C) (Oral)   Ht 5\' 4"  (1.626 m)   Wt 178 lb 6.4 oz (80.9 kg)   LMP 03/11/2014   BMI 30.62 kg/m      Assessment & Plan:  1. Sore throat - Rapid Strep Screen (Not at [facility])  2. Strep throat - Take meds as prescribed - Use a cool mist humidifier  -Force fluids -For any cough or congestion  Use plain Mucinex- regular strength or max strength is fine -For fever or aces or pains- take tylenol or ibuprofen appropriate for age and weight. -Throat lozenges if help -New toothbrush in 3 days - amoxicillin (AMOXIL) 500 MG capsule; Take 1 capsule (500 mg total) by mouth 2 (two) times daily.  Dispense: 20 capsule; Refill: 0    Evelina Dun, FNP

## 2017-11-16 NOTE — Patient Instructions (Signed)
Strep Throat Strep throat is a bacterial infection of the throat. Your health care provider may call the infection tonsillitis or pharyngitis, depending on whether there is swelling in the tonsils or at the back of the throat. Strep throat is most common during the cold months of the year in children who are 5-58 years of age, but it can happen during any season in people of any age. This infection is spread from person to person (contagious) through coughing, sneezing, or close contact. What are the causes? Strep throat is caused by the bacteria called Streptococcus pyogenes. What increases the risk? This condition is more likely to develop in:  People who spend time in crowded places where the infection can spread easily.  People who have close contact with someone who has strep throat.  What are the signs or symptoms? Symptoms of this condition include:  Fever or chills.  Redness, swelling, or pain in the tonsils or throat.  Pain or difficulty when swallowing.  White or yellow spots on the tonsils or throat.  Swollen, tender glands in the neck or under the jaw.  Red rash all over the body (rare).  How is this diagnosed? This condition is diagnosed by performing a rapid strep test or by taking a swab of your throat (throat culture test). Results from a rapid strep test are usually ready in a few minutes, but throat culture test results are available after one or two days. How is this treated? This condition is treated with antibiotic medicine. Follow these instructions at home: Medicines  Take over-the-counter and prescription medicines only as told by your health care provider.  Take your antibiotic as told by your health care provider. Do not stop taking the antibiotic even if you start to feel better.  Have family members who also have a sore throat or fever tested for strep throat. They may need antibiotics if they have the strep infection. Eating and drinking  Do not  share food, drinking cups, or personal items that could cause the infection to spread to other people.  If swallowing is difficult, try eating soft foods until your sore throat feels better.  Drink enough fluid to keep your urine clear or pale yellow. General instructions  Gargle with a salt-water mixture 3-4 times per day or as needed. To make a salt-water mixture, completely dissolve -1 tsp of salt in 1 cup of warm water.  Make sure that all household members wash their hands well.  Get plenty of rest.  Stay home from school or work until you have been taking antibiotics for 24 hours.  Keep all follow-up visits as told by your health care provider. This is important. Contact a health care provider if:  The glands in your neck continue to get bigger.  You develop a rash, cough, or earache.  You cough up a thick liquid that is green, yellow-brown, or bloody.  You have pain or discomfort that does not get better with medicine.  Your problems seem to be getting worse rather than better.  You have a fever. Get help right away if:  You have new symptoms, such as vomiting, severe headache, stiff or painful neck, chest pain, or shortness of breath.  You have severe throat pain, drooling, or changes in your voice.  You have swelling of the neck, or the skin on the neck becomes red and tender.  You have signs of dehydration, such as fatigue, dry mouth, and decreased urination.  You become increasingly sleepy, or   you cannot wake up completely.  Your joints become red or painful. This information is not intended to replace advice given to you by your health care provider. Make sure you discuss any questions you have with your health care provider. Document Released: 10/15/2000 Document Revised: 06/16/2016 Document Reviewed: 02/10/2015 Elsevier Interactive Patient Education  2018 Elsevier Inc.  

## 2017-11-18 ENCOUNTER — Encounter: Payer: Self-pay | Admitting: Nurse Practitioner

## 2017-11-18 ENCOUNTER — Ambulatory Visit: Payer: BLUE CROSS/BLUE SHIELD | Admitting: Nurse Practitioner

## 2017-11-18 VITALS — BP 119/84 | HR 73 | Temp 97.3°F | Ht 64.0 in | Wt 179.0 lb

## 2017-11-18 DIAGNOSIS — J069 Acute upper respiratory infection, unspecified: Secondary | ICD-10-CM | POA: Diagnosis not present

## 2017-11-18 DIAGNOSIS — F411 Generalized anxiety disorder: Secondary | ICD-10-CM | POA: Diagnosis not present

## 2017-11-18 DIAGNOSIS — F5101 Primary insomnia: Secondary | ICD-10-CM

## 2017-11-18 MED ORDER — CITALOPRAM HYDROBROMIDE 40 MG PO TABS: 40.0000 mg | ORAL_TABLET | Freq: Every day | ORAL | 5 refills | Status: DC

## 2017-11-18 MED ORDER — TRAZODONE HCL 50 MG PO TABS: 100.0000 mg | ORAL_TABLET | Freq: Every evening | ORAL | 3 refills | Status: DC | PRN

## 2017-11-18 NOTE — Patient Instructions (Signed)

## 2017-11-18 NOTE — Progress Notes (Signed)
   Subjective:    Patient ID: Casey Drake, female    DOB: [birth year], 57 y.o.   MRN: [id number]  HPI Patient comes in today c/o: - Patient was seen by Dorise Hiss  And tested positive for strep- was put on amoxicilin and she is not better yet. - She also says that she feels anxious. Had panic attack last week where she got flushed, heart racing and SOB. Resoved on its own. She is on celexa and says she thinks she may need to increase dose.  - trouble sleeping despite taking trazadone50mg  nightly- it use to work ral well.   Review of Systems  HENT: Positive for congestion and rhinorrhea. Negative for sore throat and trouble swallowing.   Respiratory: Positive for cough.   Cardiovascular: Negative.   Gastrointestinal: Negative.   Neurological: Negative.   Psychiatric/Behavioral: Positive for sleep disturbance. The patient is nervous/anxious.   All other systems reviewed and are negative.      Objective:   Physical Exam  Constitutional: She is oriented to person, place, and time. She appears well-developed and well-nourished. No distress.  HENT:  Right Ear: Hearing, tympanic membrane, external ear and ear canal normal.  Left Ear: Hearing, tympanic membrane, external ear and ear canal normal.  Nose: Mucosal edema and rhinorrhea present. Right sinus exhibits no maxillary sinus tenderness and no frontal sinus tenderness. Left sinus exhibits no maxillary sinus tenderness and no frontal sinus tenderness.  Mouth/Throat: Uvula is midline and oropharynx is clear and moist.  Cardiovascular: Normal rate and regular rhythm.  Pulmonary/Chest: Effort normal and breath sounds normal.  Neurological: She is alert and oriented to person, place, and time.  Skin: Skin is warm.  Psychiatric: She has a normal mood and affect. Her behavior is normal. Judgment and thought content normal.   BP 119/84   Pulse 73   Temp (!) 97.3 F (36.3 C) (Oral)   Ht 5\' 4"  (1.626 m)   Wt 179 lb (81.2 kg)   LMP  03/11/2014   BMI 30.73 kg/m         Assessment & Plan:  1. GAD (generalized anxiety disorder) Stress management Increased celexa to 40mg  daily - citalopram (CELEXA) 40 MG tablet; Take 1 tablet (40 mg total) by mouth daily.  Dispense: 30 tablet; Refill: 5  2. Primary insomnia Bedtime routine Increased trazadone to 100mg  nightly - traZODone (DESYREL) 50 MG tablet; Take 2 tablets (100 mg total) by mouth at bedtime as needed for sleep.  Dispense: 60 tablet; Refill: 3  3. Viral upper respiratory infection 1. Take meds as prescribed 2. Use a cool mist humidifier especially during the winter months and when heat has been humid. 3. Use saline nose sprays frequently 4. Saline irrigations of the nose can be very helpful if done frequently.  * 4X daily for 1 week*  * Use of a nettie pot can be helpful with this. Follow directions with this* 5. Drink plenty of fluids 6. Keep thermostat turn down low 7.For any cough or congestion  Use plain Mucinex- regular strength or max strength is fine   * Children- consult with Pharmacist for dosing 8. For fever or aces or pains- take tylenol or ibuprofen appropriate for age and weight.  * for fevers greater than 101 orally you may alternate ibuprofen and tylenol every  3 hours.   [name] Hassell Done, FNP

## 2017-11-22 ENCOUNTER — Telehealth: Payer: Self-pay | Admitting: Nurse Practitioner

## 2017-11-22 ENCOUNTER — Other Ambulatory Visit: Payer: Self-pay | Admitting: Nurse Practitioner

## 2017-11-22 MED ORDER — AZITHROMYCIN 250 MG PO TABS: ORAL_TABLET | ORAL | 0 refills | Status: DC

## 2017-11-22 NOTE — Telephone Encounter (Signed)
Please advise 

## 2017-11-22 NOTE — Progress Notes (Signed)
Left message on patients answering machine that rx sent to pharmacy

## 2017-11-22 NOTE — Progress Notes (Signed)
zpak sent to pharmacy 

## 2018-01-09 IMAGING — DX DG THORACIC SPINE 2V
2 series · 2 of 2 positions shown · non-contrast
Comparison: 05/18/2016.

CLINICAL DATA: Chest wall pain.

EXAM:
THORACIC SPINE 2 VIEWS

[t-spine ap]
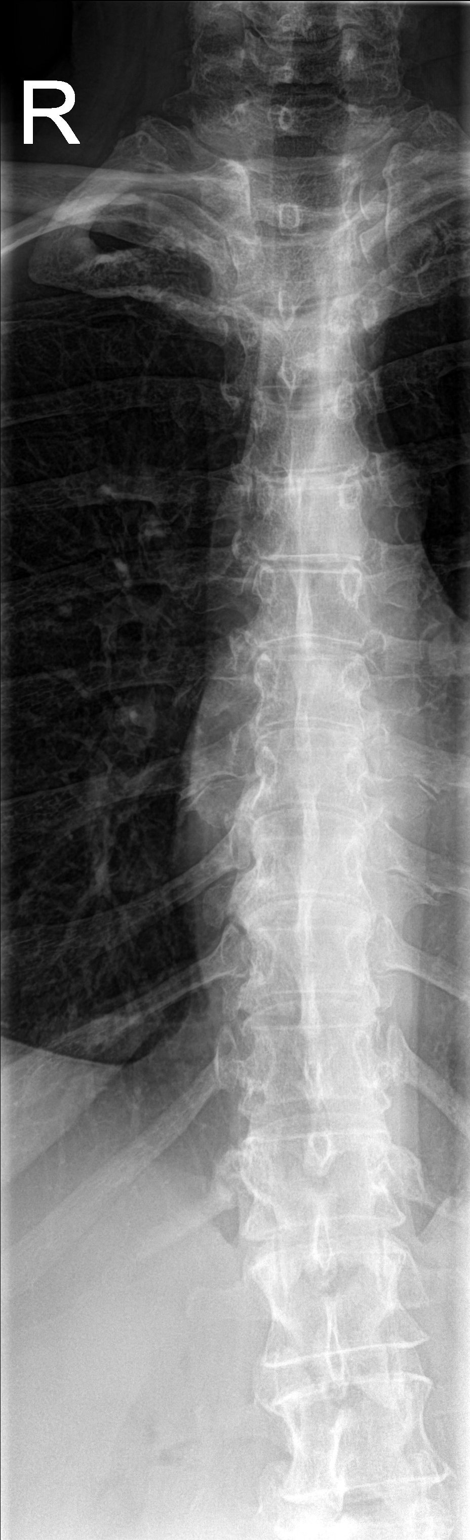

[t-spine lat]
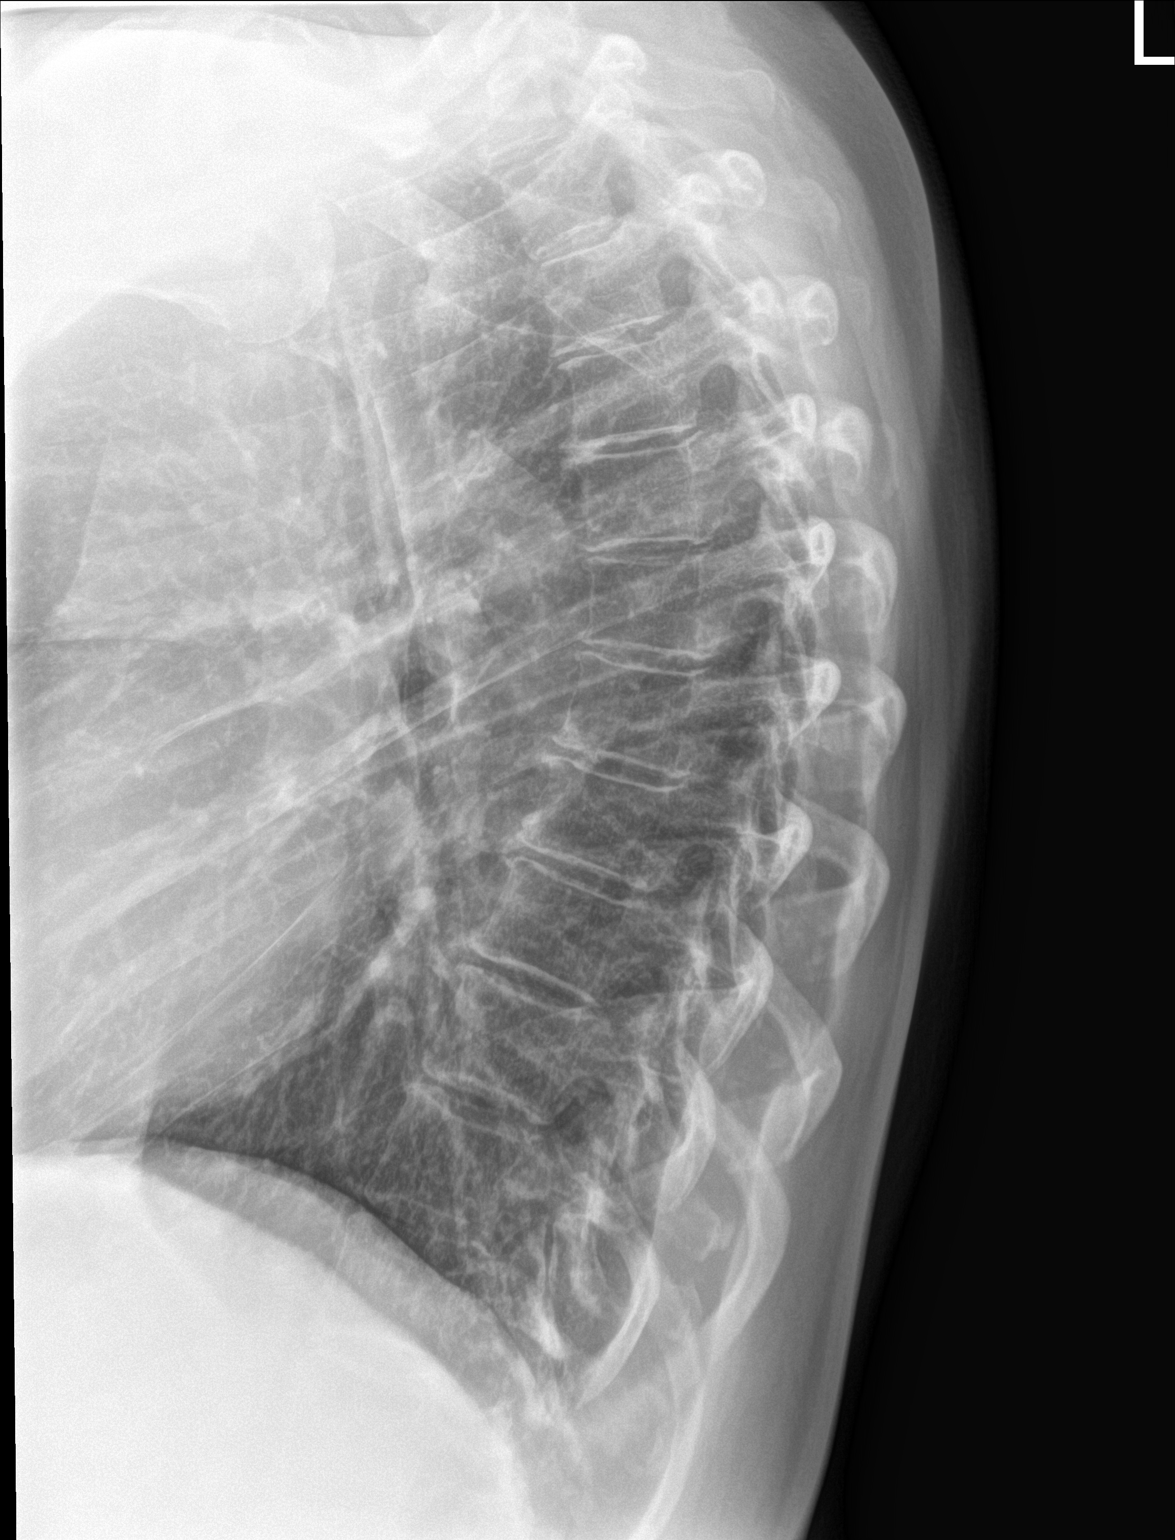

[2 of 2 positions shown; findings below may reference images not displayed]

FINDINGS: Diffuse degenerative changes mild scoliosis concave left. Diffuse
osteopenia. No acute bony abnormality identified.
IMPRESSION: Diffuse degenerative changes mild scoliosis concave left. Diffuse
osteopenia. No acute abnormality .

## 2018-01-09 IMAGING — DX DG CHEST 2V
2 series · 2 of 2 positions shown · non-contrast
Comparison: None in PACs

CLINICAL DATA: Right-sided chest wall pain and thoracic back pain.

EXAM:
CHEST  2 VIEW

[chest pa]
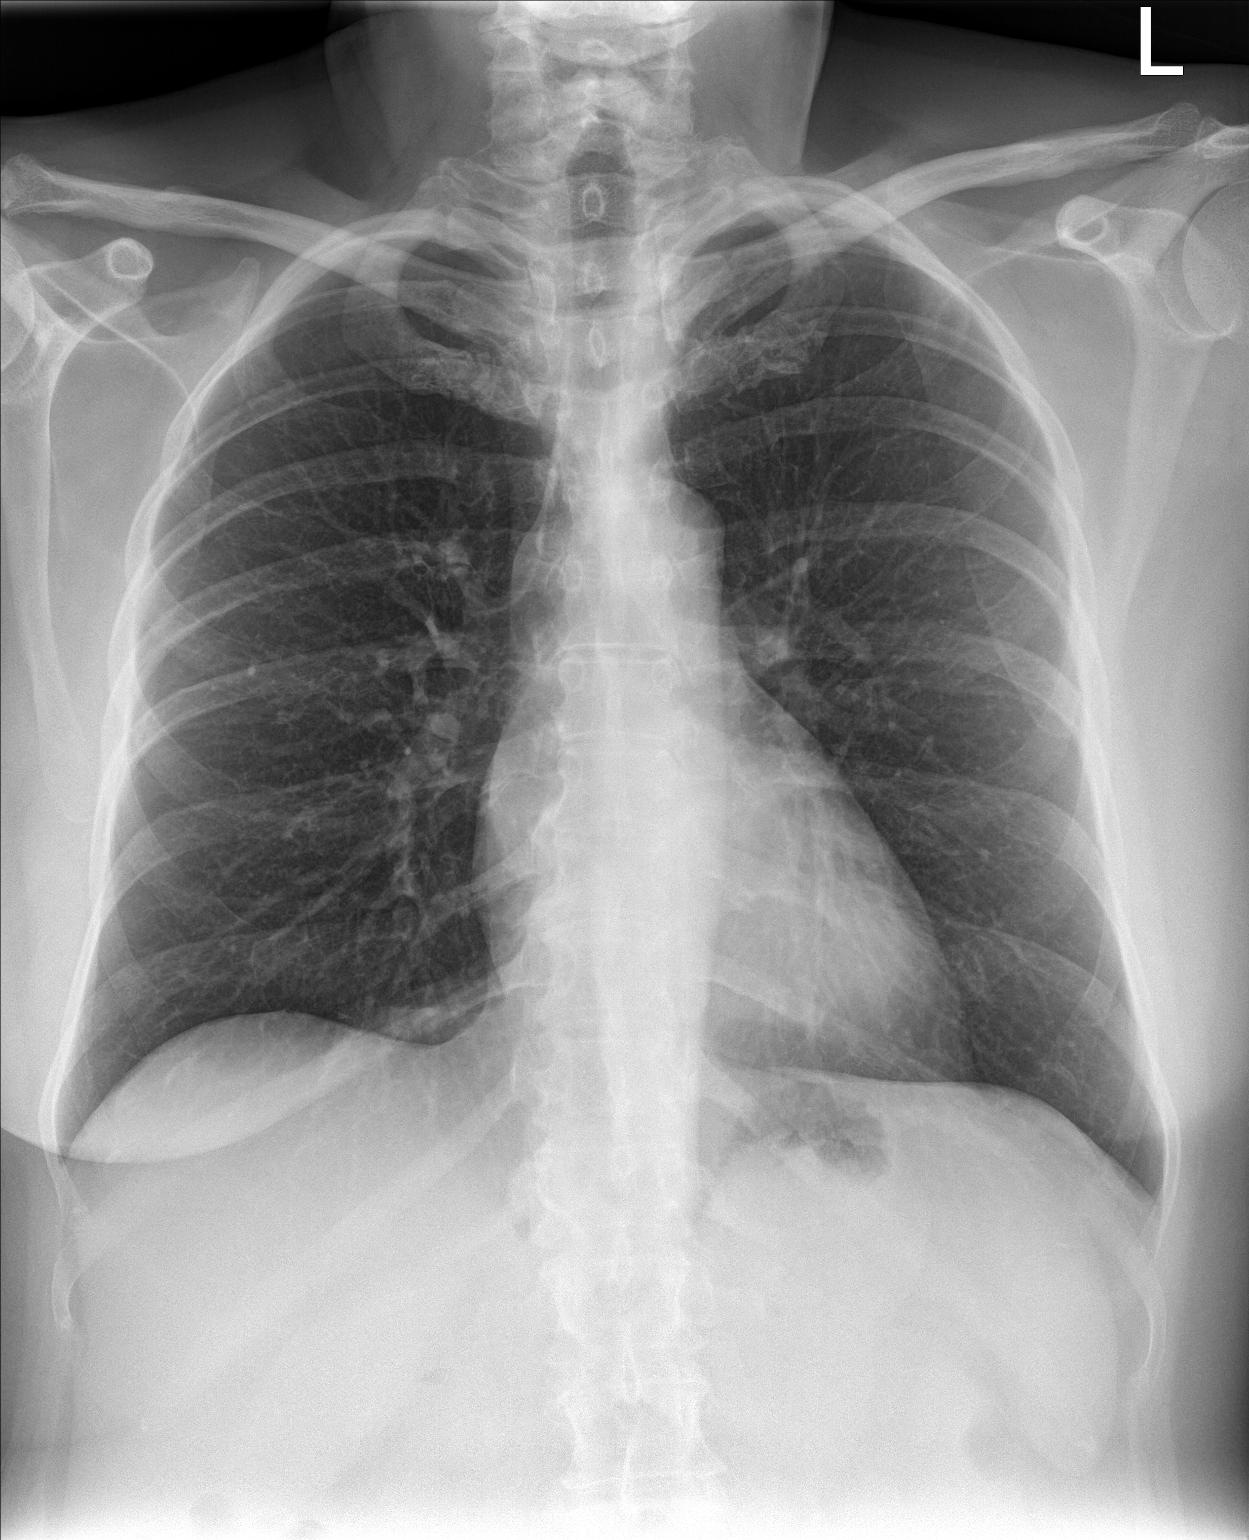

[chest lat]
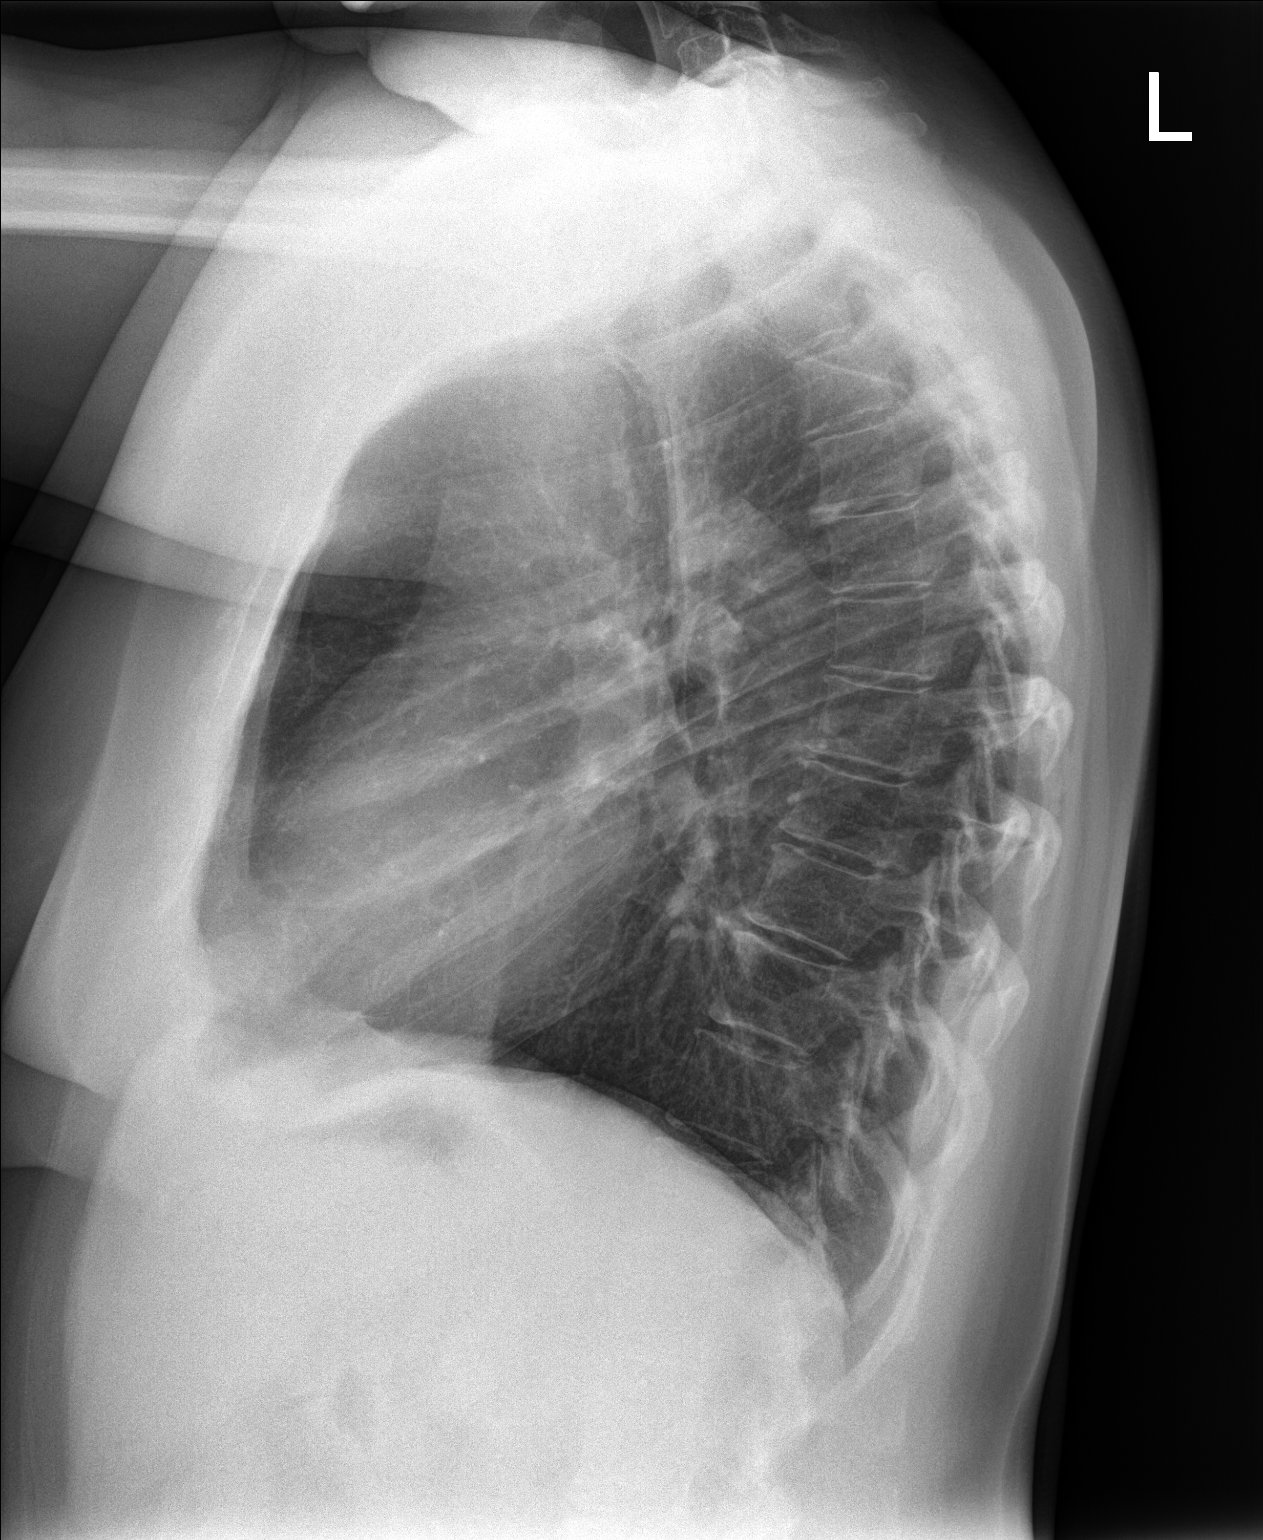

[2 of 2 positions shown; findings below may reference images not displayed]

FINDINGS: The lungs are well-expanded. There is no alveolar infiltrate or
parenchymal mass. There is no pneumothorax, pneumomediastinum, or
pleural effusion. The heart and pulmonary vascularity are normal.
The mediastinum is normal in width. The bony thorax exhibits no
acute abnormality.
IMPRESSION: There is no acute cardiopulmonary abnormality. The observed portions
of the rib cage and thoracic spine are unremarkable.

## 2018-02-10 ENCOUNTER — Other Ambulatory Visit: Payer: Self-pay

## 2018-02-10 DIAGNOSIS — F5101 Primary insomnia: Secondary | ICD-10-CM

## 2018-02-10 DIAGNOSIS — F411 Generalized anxiety disorder: Secondary | ICD-10-CM

## 2018-02-10 MED ORDER — CITALOPRAM HYDROBROMIDE 40 MG PO TABS: 40.0000 mg | ORAL_TABLET | Freq: Every day | ORAL | 0 refills | Status: DC

## 2018-02-10 MED ORDER — TRAZODONE HCL 50 MG PO TABS: 100.0000 mg | ORAL_TABLET | Freq: Every evening | ORAL | 1 refills | Status: DC | PRN

## 2018-02-10 NOTE — Telephone Encounter (Signed)
Last seen 11/18/17  MMM 

## 2018-03-08 ENCOUNTER — Other Ambulatory Visit: Payer: Self-pay | Admitting: Nurse Practitioner

## 2018-03-08 DIAGNOSIS — F411 Generalized anxiety disorder: Secondary | ICD-10-CM

## 2018-03-13 ENCOUNTER — Other Ambulatory Visit: Payer: Self-pay | Admitting: Nurse Practitioner

## 2018-03-23 ENCOUNTER — Other Ambulatory Visit: Payer: Self-pay | Admitting: Nurse Practitioner

## 2018-05-13 ENCOUNTER — Other Ambulatory Visit: Payer: Self-pay | Admitting: Nurse Practitioner

## 2018-05-13 DIAGNOSIS — F5101 Primary insomnia: Secondary | ICD-10-CM

## 2018-05-15 ENCOUNTER — Other Ambulatory Visit: Payer: Self-pay | Admitting: Family Medicine

## 2018-06-06 ENCOUNTER — Ambulatory Visit: Payer: BLUE CROSS/BLUE SHIELD | Admitting: Family

## 2018-06-11 ENCOUNTER — Other Ambulatory Visit: Payer: Self-pay | Admitting: Physician Assistant

## 2018-06-11 DIAGNOSIS — F5101 Primary insomnia: Secondary | ICD-10-CM

## 2018-06-12 NOTE — Telephone Encounter (Signed)
Last seen 11/08/17  Casey Drake

## 2018-08-08 ENCOUNTER — Encounter: Payer: Self-pay | Admitting: Nurse Practitioner

## 2018-08-08 ENCOUNTER — Ambulatory Visit: Payer: BLUE CROSS/BLUE SHIELD | Admitting: Nurse Practitioner

## 2018-08-08 VITALS — BP 134/81 | HR 66 | Temp 97.3°F | Ht 64.0 in | Wt 176.0 lb

## 2018-08-08 DIAGNOSIS — H01132 Eczematous dermatitis of right lower eyelid: Secondary | ICD-10-CM | POA: Diagnosis not present

## 2018-08-08 DIAGNOSIS — H01135 Eczematous dermatitis of left lower eyelid: Secondary | ICD-10-CM | POA: Diagnosis not present

## 2018-08-08 DIAGNOSIS — H01131 Eczematous dermatitis of right upper eyelid: Secondary | ICD-10-CM

## 2018-08-08 DIAGNOSIS — H01134 Eczematous dermatitis of left upper eyelid: Secondary | ICD-10-CM | POA: Diagnosis not present

## 2018-08-08 NOTE — Progress Notes (Signed)
   Subjective:    Patient ID: Casey Drake, female    DOB: 07-09-1961, 57 y.o.   MRN: [id number]   Chief Complaint: dryness around eyes and Dark places on skin   HPI Patient comes  in c/o eczema of bil upper and lower lids. It is very itchy. She has seen dermatologist and they gave her eucrisa and wanted her to have allergy testing. She does not want to do allergy testing. The eucrisa has not helped at all.    Review of Systems  Constitutional: Negative for activity change and appetite change.  HENT: Negative.   Eyes: Negative for pain.  Respiratory: Negative for shortness of breath.   Cardiovascular: Negative for chest pain, palpitations and leg swelling.  Gastrointestinal: Negative for abdominal pain.  Endocrine: Negative for polydipsia.  Genitourinary: Negative.   Skin: Negative for rash.  Neurological: Negative for dizziness, weakness and headaches.  Hematological: Does not bruise/bleed easily.  Psychiatric/Behavioral: Negative.   All other systems reviewed and are negative.      Objective:   Physical Exam  Cardiovascular: Normal rate and regular rhythm.  Pulmonary/Chest: Effort normal and breath sounds normal.  Skin: There is erythema (and flakiness of bil eye lids).  Psychiatric: She has a normal mood and affect. Her behavior is normal. Thought content normal.   BP 134/81   Pulse 66   Temp (!) 97.3 F (36.3 C) (Oral)   Ht 5\' 4"  (1.626 m)   Wt 176 lb (79.8 kg)   LMP 03/11/2014   BMI 30.21 kg/m        Assessment & Plan:  Casey Drake in today with chief complaint of dryness around eyes and Dark places on skin   1. Eczematous dermatitis of upper and lower eyelids of both eyes cetaphil wipes and cream Avoid scratching and picking Avoid talc Refuses steroid shot RTO prn  [name] Hassell Done, FNP

## 2018-08-08 NOTE — Patient Instructions (Signed)

## 2018-08-28 ENCOUNTER — Other Ambulatory Visit: Payer: Self-pay | Admitting: Nurse Practitioner

## 2018-09-10 ENCOUNTER — Other Ambulatory Visit: Payer: Self-pay | Admitting: Nurse Practitioner

## 2018-09-10 DIAGNOSIS — F5101 Primary insomnia: Secondary | ICD-10-CM

## 2018-09-10 DIAGNOSIS — F411 Generalized anxiety disorder: Secondary | ICD-10-CM

## 2018-11-10 ENCOUNTER — Other Ambulatory Visit: Payer: Self-pay | Admitting: Family Medicine

## 2018-11-12 ENCOUNTER — Other Ambulatory Visit: Payer: Self-pay | Admitting: Nurse Practitioner

## 2018-11-12 DIAGNOSIS — F5101 Primary insomnia: Secondary | ICD-10-CM

## 2018-11-13 NOTE — Telephone Encounter (Signed)
Last seen 08/08/18.

## 2018-12-10 ENCOUNTER — Other Ambulatory Visit: Payer: Self-pay | Admitting: Nurse Practitioner

## 2018-12-10 ENCOUNTER — Other Ambulatory Visit: Payer: Self-pay | Admitting: Family Medicine

## 2018-12-10 DIAGNOSIS — F5101 Primary insomnia: Secondary | ICD-10-CM

## 2018-12-11 NOTE — Telephone Encounter (Signed)
Last seen 08/08/18.

## 2018-12-25 ENCOUNTER — Other Ambulatory Visit: Payer: Self-pay | Admitting: Nurse Practitioner

## 2018-12-25 DIAGNOSIS — F411 Generalized anxiety disorder: Secondary | ICD-10-CM

## 2018-12-25 NOTE — Telephone Encounter (Signed)
MMM. NTBS 30 days given 09/11/18

## 2018-12-25 NOTE — Telephone Encounter (Signed)
LMTCB-CB 2/24

## 2019-01-02 ENCOUNTER — Other Ambulatory Visit: Payer: Self-pay

## 2019-01-02 ENCOUNTER — Ambulatory Visit: Payer: BLUE CROSS/BLUE SHIELD | Admitting: Nurse Practitioner

## 2019-01-02 ENCOUNTER — Encounter: Payer: Self-pay | Admitting: Nurse Practitioner

## 2019-01-02 VITALS — BP 113/75 | HR 74 | Temp 96.7°F | Ht 64.0 in | Wt 182.0 lb

## 2019-01-02 DIAGNOSIS — E782 Mixed hyperlipidemia: Secondary | ICD-10-CM

## 2019-01-02 DIAGNOSIS — F411 Generalized anxiety disorder: Secondary | ICD-10-CM | POA: Diagnosis not present

## 2019-01-02 DIAGNOSIS — F3342 Major depressive disorder, recurrent, in full remission: Secondary | ICD-10-CM

## 2019-01-02 DIAGNOSIS — F5101 Primary insomnia: Secondary | ICD-10-CM

## 2019-01-02 DIAGNOSIS — M797 Fibromyalgia: Secondary | ICD-10-CM | POA: Diagnosis not present

## 2019-01-02 DIAGNOSIS — B001 Herpesviral vesicular dermatitis: Secondary | ICD-10-CM

## 2019-01-02 MED ORDER — VALACYCLOVIR HCL 1 G PO TABS: 1000.0000 mg | ORAL_TABLET | Freq: Two times a day (BID) | ORAL | 2 refills | Status: DC

## 2019-01-02 MED ORDER — TRAZODONE HCL 50 MG PO TABS: ORAL_TABLET | ORAL | 1 refills | Status: DC

## 2019-01-02 MED ORDER — METOPROLOL SUCCINATE ER 50 MG PO TB24: ORAL_TABLET | ORAL | 1 refills | Status: DC

## 2019-01-02 MED ORDER — GABAPENTIN 100 MG PO CAPS: ORAL_CAPSULE | ORAL | 1 refills | Status: DC

## 2019-01-02 MED ORDER — CITALOPRAM HYDROBROMIDE 40 MG PO TABS: 40.0000 mg | ORAL_TABLET | Freq: Every day | ORAL | 0 refills | Status: DC

## 2019-01-02 NOTE — Progress Notes (Signed)
Subjective:    Patient ID: Casey Drake, female    DOB: 1961/02/16, 58 y.o.   MRN: [id number]   Chief Complaint: Medical Management of Chronic Issues ([place]ole removals)   HPI:  1. Moderate mixed hyperlipidemia not requiring statin therapy Does not really watch diet and does very little exercise.   2. Recurrent major depressive disorder, in full remission [facility])  is currently on celexa daily and is doing well.  Depression screen [facility] 2/9 01/02/2019 08/08/2018 11/18/2017  Decreased Interest 0 0 0  Down, Depressed, Hopeless 0 0 0  PHQ - 2 Score 0 0 0     3. GAD (generalized anxiety disorder)  Is a very anxious person. 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  4. Fibromyalgia  Has allover body pain and it varies from day to day. Some days are worse then others. Takes gabapentin that seems to make pain more tolerable.     Outpatient Encounter Medications as of 01/02/2019  Medication Sig  . Calcium Carbonate-Vitamin D (CALCIUM-VITAMIN D) 500-200 MG-UNIT tablet Take 1 tablet by mouth daily.  . citalopram (CELEXA) 40 MG tablet Take 1 tablet (40 mg total) by mouth daily. (Needs to be seen before next refill)  . fish oil-omega-3 fatty acids 1000 MG capsule Take 1 g by mouth daily.    Marland Kitchen gabapentin (NEURONTIN) 100 MG capsule TAKE 1 CAPSULE 3 TIMES A DAY  . Lysine 1000 MG TABS Take by mouth.  . metoprolol succinate (TOPROL-XL) 50 MG 24 hr tablet TAKE 1 TABLET BY MOUTH EVERY DAY WITH OR IMMEDIATELY FOLLOWING A MEAL  . traZODone (DESYREL) 50 MG tablet TAKE 2 TABLETS AT BEDTIME AS NEEDED SLEEP  . valACYclovir (VALTREX) 1000 MG tablet Take 1 tablet (1,000 mg total) by mouth 2 (two) times daily. One time at fever blister onset (Patient not taking: Reported on 01/02/2019)  . [DISCONTINUED] Crisaborole (EUCRISA) 2 % OINT Apply 1 application topically daily. (Patient not taking: Reported on 08/08/2018)       New complaints: None today  Social history: Lives  alone   Review of Systems  Constitutional: Negative for activity change and appetite change.  HENT: Negative.   Eyes: Negative for pain.  Respiratory: Negative for shortness of breath.   Cardiovascular: Negative for chest pain, palpitations and leg swelling.  Gastrointestinal: Negative for abdominal pain.  Endocrine: Negative for polydipsia.  Genitourinary: Negative.   Skin: Negative for rash.  Neurological: Negative for dizziness, weakness and headaches.  Hematological: Does not bruise/bleed easily.  Psychiatric/Behavioral: Negative.   All other systems reviewed and are negative.      Objective:   Physical Exam Vitals signs and nursing note reviewed.  Constitutional:      General: She is not in acute distress.    Appearance: Normal appearance. She is well-developed.  HENT:     Head: Normocephalic.     Nose: Nose normal.  Eyes:     Pupils: Pupils are equal, round, and reactive to light.  Neck:     Musculoskeletal: Normal range of motion and neck supple.     Vascular: No carotid bruit or JVD.  Cardiovascular:     Rate and Rhythm: Normal rate and regular rhythm.     Heart sounds: Normal heart sounds.  Pulmonary:     Effort: Pulmonary effort is normal. No respiratory distress.     Breath sounds: Normal breath sounds. No wheezing or rales.  Chest:     Chest wall: No tenderness.  Abdominal:  General: Bowel sounds are normal. There is no distension or abdominal bruit.     Palpations: Abdomen is soft. There is no hepatomegaly, splenomegaly, mass or pulsatile mass.     Tenderness: There is no abdominal tenderness.  Musculoskeletal: Normal range of motion.  Lymphadenopathy:     Cervical: No cervical adenopathy.  Skin:    General: Skin is warm and dry.  Neurological:     Mental Status: She is alert and oriented to person, place, and time.     Deep Tendon Reflexes: Reflexes are normal and symmetric.  Psychiatric:        Behavior: Behavior normal.        Thought  Content: Thought content normal.        Judgment: Judgment normal.    BP 113/75   Pulse 74   Temp (!) 96.7 F (35.9 C) (Oral)   Ht 5\' 4"  (1.626 m)   Wt 182 lb (82.6 kg)   LMP 03/11/2014   BMI 31.24 kg/m         Assessment & Plan:  Casey Drake comes in today with chief complaint of Medical Management of Chronic Issues (Mole removals)   Diagnosis and orders addressed:  1. Moderate mixed hyperlipidemia not requiring statin therapy Low fat diet Labs pending  2. Recurrent major depressive disorder, in full remission [facility]) Stress management  3. GAD (generalized anxiety disorder) Stress management - citalopram (CELEXA) 40 MG tablet; Take 1 tablet (40 mg total) by mouth daily. (Needs to be seen before next refill)  Dispense: 30 tablet; Refill: 0  4. Fibromyalgia Exercise to keep muscles warm - gabapentin (NEURONTIN) 100 MG capsule; TAKE 1 CAPSULE 3 TIMES A DAY  Dispense: 270 capsule; Refill: 1  5. Primary insomnia Bedtime routine - traZODone (DESYREL) 50 MG tablet; TAKE 2 TABLETS AT BEDTIME AS NEEDED SLEEP  Dispense: 180 tablet; Refill: 1   Labs pending Health Maintenance reviewed Diet and exercise encouraged  Follow up plan: 6 months   [name] Hassell Done, FNP

## 2019-01-02 NOTE — Patient Instructions (Signed)

## 2019-01-26 ENCOUNTER — Other Ambulatory Visit: Payer: Self-pay | Admitting: Nurse Practitioner

## 2019-01-26 DIAGNOSIS — F411 Generalized anxiety disorder: Secondary | ICD-10-CM

## 2019-05-08 ENCOUNTER — Other Ambulatory Visit: Payer: Self-pay

## 2019-05-08 ENCOUNTER — Ambulatory Visit (INDEPENDENT_AMBULATORY_CARE_PROVIDER_SITE_OTHER): Payer: BC Managed Care – PPO | Admitting: Nurse Practitioner

## 2019-05-08 ENCOUNTER — Encounter: Payer: Self-pay | Admitting: Nurse Practitioner

## 2019-05-08 DIAGNOSIS — J301 Allergic rhinitis due to pollen: Secondary | ICD-10-CM

## 2019-05-08 MED ORDER — FLUTICASONE PROPIONATE 50 MCG/ACT NA SUSP: 2.0000 | Freq: Every day | NASAL | 6 refills | Status: DC

## 2019-05-08 NOTE — Progress Notes (Signed)
   Virtual Visit via telephone Note  I connected with Casey Drake on 05/08/19 at 2:30 PM by telephone and verified that I am speaking with the correct person using two identifiers. Casey Drake is currently located at home and no one is currently with her during visit. The provider, [name] Hassell Done, FNP is located in their office at time of visit.  I discussed the limitations, risks, security and privacy concerns of performing an evaluation and management service by telephone and the availability of in person appointments. I also discussed with the patient that there may be a patient responsible charge related to this service. The patient expressed understanding and agreed to proceed.   History and Present Illness:   Chief Complaint: Sinusitis   HPI Patient calls in for appointment today stating  That she has had a scratchy throat and fatigue. Mild sinus drainage. Says that her arms feel heavy.  Started a couple of days ago.    Review of Systems  Constitutional: Negative for chills, diaphoresis, fever and weight loss.  HENT: Positive for congestion and sore throat. Negative for ear pain.   Eyes: Negative for blurred vision, double vision and pain.  Respiratory: Negative.  Negative for shortness of breath.   Cardiovascular: Negative.  Negative for chest pain, palpitations, orthopnea and leg swelling.  Gastrointestinal: Negative for abdominal pain.  Genitourinary: Negative.   Skin: Negative for rash.  Neurological: Negative.  Negative for dizziness, sensory change, loss of consciousness, weakness and headaches.  Endo/Heme/Allergies: Negative for polydipsia. Does not bruise/bleed easily.  Psychiatric/Behavioral: Negative.  Negative for memory loss. The patient does not have insomnia.   All other systems reviewed and are negative.    Observations/Objective: Alert and oriented- answers all questions appropriately No distress  Assessment and Plan: Casey Drake in  today with chief complaint of Sinusitis   1. Allergic rhinitis due to pollen, unspecified seasonality claritin focre fluids Rest RTO prn  - fluticasone (FLONASE) 50 MCG/ACT nasal spray; Place 2 sprays into both nostrils daily.  Dispense: 16 g; Refill: 6    Follow Up Instructions:   I discussed the assessment and treatment plan with the patient. The patient was provided an opportunity to ask questions and all were answered. The patient agreed with the plan and demonstrated an understanding of the instructions.   The patient was advised to call back or seek an in-person evaluation if the symptoms worsen or if the condition fails to improve as anticipated.  The above assessment and management plan was discussed with the patient. The patient verbalized understanding of and has agreed to the management plan. Patient is aware to call the clinic if symptoms persist or worsen. Patient is aware when to return to the clinic for a follow-up visit. Patient educated on when it is appropriate to go to the emergency department.   Time call ended:  2:40  I provided 10 minutes of non-face-to-face time during this encounter.    [name] Hassell Done, FNP

## 2019-07-23 ENCOUNTER — Other Ambulatory Visit: Payer: Self-pay | Admitting: Nurse Practitioner

## 2019-07-23 DIAGNOSIS — F411 Generalized anxiety disorder: Secondary | ICD-10-CM

## 2019-08-16 ENCOUNTER — Other Ambulatory Visit: Payer: Self-pay | Admitting: Nurse Practitioner

## 2019-08-16 DIAGNOSIS — F411 Generalized anxiety disorder: Secondary | ICD-10-CM

## 2019-08-21 ENCOUNTER — Other Ambulatory Visit: Payer: Self-pay | Admitting: Nurse Practitioner

## 2019-08-27 ENCOUNTER — Other Ambulatory Visit: Payer: Self-pay | Admitting: Nurse Practitioner

## 2019-08-27 DIAGNOSIS — F5101 Primary insomnia: Secondary | ICD-10-CM

## 2019-08-31 ENCOUNTER — Ambulatory Visit (INDEPENDENT_AMBULATORY_CARE_PROVIDER_SITE_OTHER): Payer: BC Managed Care – PPO | Admitting: Nurse Practitioner

## 2019-08-31 ENCOUNTER — Encounter: Payer: Self-pay | Admitting: Nurse Practitioner

## 2019-08-31 DIAGNOSIS — F5101 Primary insomnia: Secondary | ICD-10-CM

## 2019-08-31 DIAGNOSIS — F411 Generalized anxiety disorder: Secondary | ICD-10-CM

## 2019-08-31 DIAGNOSIS — E782 Mixed hyperlipidemia: Secondary | ICD-10-CM

## 2019-08-31 DIAGNOSIS — M797 Fibromyalgia: Secondary | ICD-10-CM

## 2019-08-31 DIAGNOSIS — F3342 Major depressive disorder, recurrent, in full remission: Secondary | ICD-10-CM

## 2019-08-31 MED ORDER — GABAPENTIN 100 MG PO CAPS: ORAL_CAPSULE | ORAL | 1 refills | Status: DC

## 2019-08-31 MED ORDER — TRAZODONE HCL 50 MG PO TABS: ORAL_TABLET | ORAL | 1 refills | Status: DC

## 2019-08-31 MED ORDER — CITALOPRAM HYDROBROMIDE 40 MG PO TABS: 40.0000 mg | ORAL_TABLET | Freq: Every day | ORAL | 1 refills | Status: DC

## 2019-08-31 MED ORDER — METOPROLOL SUCCINATE ER 50 MG PO TB24: ORAL_TABLET | ORAL | 1 refills | Status: DC
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
## 2019-10-09 ENCOUNTER — Other Ambulatory Visit: Payer: Self-pay | Admitting: Nurse Practitioner

## 2020-01-25 ENCOUNTER — Other Ambulatory Visit: Payer: Self-pay | Admitting: Nurse Practitioner

## 2020-01-25 DIAGNOSIS — F5101 Primary insomnia: Secondary | ICD-10-CM

## 2020-02-20 ENCOUNTER — Other Ambulatory Visit: Payer: Self-pay | Admitting: Nurse Practitioner

## 2020-02-20 DIAGNOSIS — F411 Generalized anxiety disorder: Secondary | ICD-10-CM

## 2020-03-15 ENCOUNTER — Other Ambulatory Visit: Payer: Self-pay | Admitting: Nurse Practitioner

## 2020-03-15 DIAGNOSIS — F411 Generalized anxiety disorder: Secondary | ICD-10-CM

## 2020-03-17 ENCOUNTER — Telehealth: Payer: Self-pay | Admitting: Nurse Practitioner

## 2020-03-17 NOTE — Telephone Encounter (Signed)
Left message to please call our office. No lab work here.  Last results in chart are in 2016.   Patient needs an appointment  unless provider decides to fill scripts.  Which scripts are being requested?

## 2020-03-17 NOTE — Telephone Encounter (Signed)
lmtcb to schedule follow up appt 

## 2020-03-17 NOTE — Telephone Encounter (Signed)
Please review and advise.

## 2020-03-17 NOTE — Telephone Encounter (Signed)
Yes patient needs to e seen

## 2020-03-17 NOTE — Telephone Encounter (Signed)
MMM NTBS 30 days given 02/20/20

## 2020-03-21 NOTE — Telephone Encounter (Signed)
Patient has a follow up appointment scheduled. 

## 2020-04-03 ENCOUNTER — Encounter: Payer: Self-pay | Admitting: Nurse Practitioner

## 2020-04-03 ENCOUNTER — Other Ambulatory Visit: Payer: Self-pay

## 2020-04-03 ENCOUNTER — Ambulatory Visit: Payer: BC Managed Care – PPO | Admitting: Nurse Practitioner

## 2020-04-03 VITALS — BP 129/83 | HR 63 | Temp 98.2°F | Resp 20 | Ht 64.0 in | Wt 173.0 lb

## 2020-04-03 DIAGNOSIS — Z1159 Encounter for screening for other viral diseases: Secondary | ICD-10-CM

## 2020-04-03 DIAGNOSIS — F3342 Major depressive disorder, recurrent, in full remission: Secondary | ICD-10-CM

## 2020-04-03 DIAGNOSIS — E782 Mixed hyperlipidemia: Secondary | ICD-10-CM | POA: Diagnosis not present

## 2020-04-03 DIAGNOSIS — B001 Herpesviral vesicular dermatitis: Secondary | ICD-10-CM | POA: Insufficient documentation

## 2020-04-03 DIAGNOSIS — F5101 Primary insomnia: Secondary | ICD-10-CM | POA: Diagnosis not present

## 2020-04-03 DIAGNOSIS — M797 Fibromyalgia: Secondary | ICD-10-CM | POA: Diagnosis not present

## 2020-04-03 DIAGNOSIS — R000 Tachycardia, unspecified: Secondary | ICD-10-CM | POA: Insufficient documentation

## 2020-04-03 DIAGNOSIS — B009 Herpesviral infection, unspecified: Secondary | ICD-10-CM | POA: Insufficient documentation

## 2020-04-03 MED ORDER — GABAPENTIN 100 MG PO CAPS: ORAL_CAPSULE | ORAL | 1 refills | Status: DC

## 2020-04-03 MED ORDER — CITALOPRAM HYDROBROMIDE 40 MG PO TABS: 40.0000 mg | ORAL_TABLET | Freq: Every day | ORAL | 1 refills | Status: DC

## 2020-04-03 MED ORDER — METOPROLOL SUCCINATE ER 50 MG PO TB24: ORAL_TABLET | ORAL | 1 refills | Status: DC

## 2020-04-03 MED ORDER — RISPERIDONE 0.5 MG PO TABS: 0.5000 mg | ORAL_TABLET | Freq: Every day | ORAL | 2 refills | Status: DC

## 2020-04-03 MED ORDER — CITALOPRAM HYDROBROMIDE 40 MG PO TABS: 40.0000 mg | ORAL_TABLET | Freq: Every day | ORAL | 0 refills | Status: DC

## 2020-04-03 MED ORDER — VALACYCLOVIR HCL 1 G PO TABS: 1000.0000 mg | ORAL_TABLET | Freq: Two times a day (BID) | ORAL | 2 refills | Status: DC
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
## 2020-04-03 NOTE — Patient Instructions (Signed)
Stress, Adult °Stress is a normal reaction to life events. Stress is what you feel when life demands more than you are used to, or more than you think you can handle. Some stress can be useful, such as studying for a test or meeting a deadline at work. Stress that occurs too often or for too long can cause problems. It can affect your emotional health and interfere with relationships and normal daily activities. Too much stress can weaken your body's defense system (immune system) and increase your risk for physical illness. If you already have a medical problem, stress can make it worse. °What are the causes? °All sorts of life events can cause stress. An event that causes stress for one person may not be stressful for another person. Major life events, whether positive or negative, commonly cause stress. Examples include: °· Losing a job or starting a new job. °· Losing a loved one. °· Moving to a new town or home. °· Getting married or divorced. °· Having a baby. °· Getting injured or sick. °Less obvious life events can also cause stress, especially if they occur day after day or in combination with each other. Examples include: °· Working long hours. °· Driving in traffic. °· Caring for children. °· Being in debt. °· Being in a difficult relationship. °What are the signs or symptoms? °Stress can cause emotional symptoms, including: °· Anxiety. This is feeling worried, afraid, on edge, overwhelmed, or out of control. °· Anger, including irritation or impatience. °· Depression. This is feeling sad, down, helpless, or guilty. °· Trouble focusing, remembering, or making decisions. °Stress can cause physical symptoms, including: °· Aches and pains. These may affect your head, neck, back, stomach, or other areas of your body. °· Tight muscles or a clenched jaw. °· Low energy. °· Trouble sleeping. °Stress can cause unhealthy behaviors, including: °· Eating to feel better (overeating) or skipping meals. °· Working too  much or putting off tasks. °· Smoking, drinking alcohol, or using drugs to feel better. °How is this diagnosed? °Stress is diagnosed through an assessment by your health care provider. He or she may diagnose this condition based on: °· Your symptoms and any stressful life events. °· Your medical history. °· Tests to rule out other causes of your symptoms. °Depending on your condition, your health care provider may refer you to a specialist for further evaluation. °How is this treated? ° °Stress management techniques are the recommended treatment for stress. Medicine is not typically recommended for the treatment of stress. °Techniques to reduce your reaction to stressful life events include: °· Stress identification. Monitor yourself for symptoms of stress and identify what causes stress for you. These skills may help you to avoid or prepare for stressful events. °· Time management. Set your priorities, keep a calendar of events, and learn to say no. Taking these actions can help you avoid making too many commitments. °Techniques for coping with stress include: °· Rethinking the problem. Try to think realistically about stressful events rather than ignoring them or overreacting. Try to find the positives in a stressful situation rather than focusing on the negatives. °· Exercise. Physical exercise can release both physical and emotional tension. The key is to find a form of exercise that you enjoy and do it regularly. °· Relaxation techniques. These relax the body and mind. The key is to find one or more that you enjoy and use the techniques regularly. Examples include: °? Meditation, deep breathing, or progressive relaxation techniques. °? Yoga or   tai chi. ? Biofeedback, mindfulness techniques, or journaling. ? Listening to music, being out in nature, or participating in other hobbies.  Practicing a healthy lifestyle. Eat a balanced diet, drink plenty of water, limit or avoid caffeine, and get plenty of  sleep.  Having a strong support network. Spend time with family, friends, or other people you enjoy being around. Express your feelings and talk things over with someone you trust. Counseling or talk therapy with a mental health professional may be helpful if you are having trouble managing stress on your own. Follow these instructions at home: Lifestyle   Avoid drugs.  Do not use any products that contain nicotine or tobacco, such as cigarettes, e-cigarettes, and chewing tobacco. If you need help quitting, ask your health care provider.  Limit alcohol intake to no more than 1 drink a day for nonpregnant women and 2 drinks a day for men. One drink equals 12 oz of beer, 5 oz of wine, or 1 oz of hard liquor  Do not use alcohol or drugs to relax.  Eat a balanced diet that includes fresh fruits and vegetables, whole grains, lean meats, fish, eggs, and beans, and low-fat dairy. Avoid processed foods and foods high in added fat, sugar, and salt.  Exercise at least 30 minutes on 5 or more days each week.  Get 7-8 hours of sleep each night. General instructions   Practice stress management techniques as discussed with your health care provider.  Drink enough fluid to keep your urine clear or pale yellow.  Take over-the-counter and prescription medicines only as told by your health care provider.  Keep all follow-up visits as told by your health care provider. This is important. Contact a health care provider if:  Your symptoms get worse.  You have new symptoms.  You feel overwhelmed by your problems and can no longer manage them on your own. Get help right away if:  You have thoughts of hurting yourself or others. If you ever feel like you may hurt yourself or others, or have thoughts about taking your own life, get help right away. You can go to your nearest emergency department or call:  Your local emergency services (911 in the [place]  A suicide crisis helpline, such as the  [place] at 425 850 5899. This is open 24 hours a day. Summary  Stress is a normal reaction to life events. It can cause problems if it happens too often or for too long.  Practicing stress management techniques is the best way to treat stress.  Counseling or talk therapy with a mental health professional may be helpful if you are having trouble managing stress on your own. This information is not intended to replace advice given to you by your health care provider. Make sure you discuss any questions you have with your health care provider. Document Revised: 05/18/2019 Document Reviewed: 12/08/2016 Elsevier Patient Education  [place].

## 2020-04-04 LAB — CMP14+EGFR
ALT: 34 IU/L — ABNORMAL HIGH (ref 0–32)
AST: 28 IU/L (ref 0–40)
Albumin/Globulin Ratio: 2 (ref 1.2–2.2)
Albumin: 4.6 g/dL (ref 3.8–4.9)
Alkaline Phosphatase: 77 IU/L (ref 48–121)
BUN/Creatinine Ratio: 20 (ref 9–23)
BUN: 16 mg/dL (ref 6–24)
Bilirubin Total: 0.3 mg/dL (ref 0.0–1.2)
CO2: 22 mmol/L (ref 20–29)
Calcium: 9.6 mg/dL (ref 8.7–10.2)
Chloride: 103 mmol/L (ref 96–106)
Creatinine, Ser: 0.79 mg/dL (ref 0.57–1.00)
GFR calc Af Amer: 95 mL/min/{1.73_m2} (ref >59)
GFR calc non Af Amer: 82 mL/min/{1.73_m2} (ref >59)
Globulin, Total: 2.3 g/dL (ref 1.5–4.5)
Glucose: 94 mg/dL (ref 65–99)
Potassium: 4.6 mmol/L (ref 3.5–5.2)
Sodium: 140 mmol/L (ref 134–144)
Total Protein: 6.9 g/dL (ref 6.0–8.5)

## 2020-04-04 LAB — LIPID PANEL
Chol/HDL Ratio: 5.7 ratio — ABNORMAL HIGH (ref 0.0–4.4)
Cholesterol, Total: 301 mg/dL — ABNORMAL HIGH (ref 100–199)
HDL: 53 mg/dL (ref >39)
LDL Chol Calc (NIH): 176 mg/dL — ABNORMAL HIGH (ref 0–99)
Triglycerides: 368 mg/dL — ABNORMAL HIGH (ref 0–149)
VLDL Cholesterol Cal: 72 mg/dL — ABNORMAL HIGH (ref 5–40)

## 2020-04-04 LAB — HEPATITIS C ANTIBODY: Hep C Virus Ab: <0.1 s/co ratio (ref 0.0–0.9)

## 2020-04-20 ENCOUNTER — Other Ambulatory Visit: Payer: Self-pay | Admitting: Nurse Practitioner

## 2020-04-20 DIAGNOSIS — F5101 Primary insomnia: Secondary | ICD-10-CM

## 2020-04-25 ENCOUNTER — Other Ambulatory Visit: Payer: Self-pay | Admitting: Nurse Practitioner

## 2020-04-25 DIAGNOSIS — F5101 Primary insomnia: Secondary | ICD-10-CM

## 2020-04-28 NOTE — Telephone Encounter (Signed)
Pt seen 04/03/20 and was given #30 with 2 refills but pt requested 90 day supply so 90 day supply sent over with 0 refills.

## 2020-06-19 ENCOUNTER — Ambulatory Visit: Payer: BC Managed Care – PPO | Admitting: Family Medicine

## 2020-08-09 ENCOUNTER — Other Ambulatory Visit: Payer: Self-pay | Admitting: Nurse Practitioner

## 2020-08-09 DIAGNOSIS — F5101 Primary insomnia: Secondary | ICD-10-CM

## 2020-10-17 ENCOUNTER — Other Ambulatory Visit: Payer: Self-pay | Admitting: Nurse Practitioner

## 2020-10-17 DIAGNOSIS — F3342 Major depressive disorder, recurrent, in full remission: Secondary | ICD-10-CM

## 2020-10-22 ENCOUNTER — Other Ambulatory Visit: Payer: Self-pay

## 2020-10-22 ENCOUNTER — Encounter: Payer: Self-pay | Admitting: Nurse Practitioner

## 2020-10-22 ENCOUNTER — Ambulatory Visit: Payer: BC Managed Care – PPO | Admitting: Nurse Practitioner

## 2020-10-22 VITALS — BP 133/81 | HR 61 | Temp 98.6°F | Ht 64.0 in | Wt 164.6 lb

## 2020-10-22 DIAGNOSIS — E782 Mixed hyperlipidemia: Secondary | ICD-10-CM | POA: Diagnosis not present

## 2020-10-22 DIAGNOSIS — R000 Tachycardia, unspecified: Secondary | ICD-10-CM

## 2020-10-22 DIAGNOSIS — B009 Herpesviral infection, unspecified: Secondary | ICD-10-CM

## 2020-10-22 DIAGNOSIS — F411 Generalized anxiety disorder: Secondary | ICD-10-CM

## 2020-10-22 DIAGNOSIS — F5101 Primary insomnia: Secondary | ICD-10-CM | POA: Diagnosis not present

## 2020-10-22 DIAGNOSIS — M797 Fibromyalgia: Secondary | ICD-10-CM

## 2020-10-22 DIAGNOSIS — F3342 Major depressive disorder, recurrent, in full remission: Secondary | ICD-10-CM

## 2020-10-22 MED ORDER — METHYLPREDNISOLONE ACETATE 80 MG/ML IJ SUSP
80.0000 mg | Freq: Once | INTRAMUSCULAR | Status: AC
  Administered 2020-10-22: 80 mg via INTRAMUSCULAR

## 2020-10-22 MED ORDER — METOPROLOL SUCCINATE ER 50 MG PO TB24
ORAL_TABLET | ORAL | 1 refills | Status: DC
Start: 2020-10-22 — End: 2021-05-26

## 2020-10-22 MED ORDER — VALACYCLOVIR HCL 1 G PO TABS: 1000.0000 mg | ORAL_TABLET | Freq: Two times a day (BID) | ORAL | 2 refills | Status: AC

## 2020-10-22 MED ORDER — GABAPENTIN 100 MG PO CAPS: ORAL_CAPSULE | ORAL | 1 refills | Status: DC

## 2020-10-22 MED ORDER — RISPERIDONE 0.5 MG PO TABS: 0.5000 mg | ORAL_TABLET | Freq: Every day | ORAL | 1 refills | Status: DC

## 2020-10-22 MED ORDER — PREDNISONE 10 MG (21) PO TBPK
ORAL_TABLET | ORAL | 0 refills | Status: DC
Start: 2020-10-22 — End: 2020-10-22

## 2020-10-22 MED ORDER — CITALOPRAM HYDROBROMIDE 40 MG PO TABS: 40.0000 mg | ORAL_TABLET | Freq: Every day | ORAL | 1 refills | Status: DC

## 2020-10-22 NOTE — Patient Instructions (Signed)
Stress, Adult Stress is a normal reaction to life events. Stress is what you feel when life demands more than you are used to, or more than you think you can handle. Some stress can be useful, such as studying for a test or meeting a deadline at work. Stress that occurs too often or for too long can cause problems. It can affect your emotional health and interfere with relationships and normal daily activities. Too much stress can weaken your body's defense system (immune system) and increase your risk for physical illness. If you already have a medical problem, stress can make it worse. What are the causes? All sorts of life events can cause stress. An event that causes stress for one person may not be stressful for another person. Major life events, whether positive or negative, commonly cause stress. Examples include:  Losing a job or starting a new job.  Losing a loved one.  Moving to a new town or home.  Getting married or divorced.  Having a baby.  Getting injured or sick. Less obvious life events can also cause stress, especially if they occur day after day or in combination with each other. Examples include:  Working long hours.  Driving in traffic.  Caring for children.  Being in debt.  Being in a difficult relationship. What are the signs or symptoms? Stress can cause emotional symptoms, including:  Anxiety. This is feeling worried, afraid, on edge, overwhelmed, or out of control.  Anger, including irritation or impatience.  Depression. This is feeling sad, down, helpless, or guilty.  Trouble focusing, remembering, or making decisions. Stress can cause physical symptoms, including:  Aches and pains. These may affect your head, neck, back, stomach, or other areas of your body.  Tight muscles or a clenched jaw.  Low energy.  Trouble sleeping. Stress can cause unhealthy behaviors, including:  Eating to feel better (overeating) or skipping meals.  Working too  much or putting off tasks.  Smoking, drinking alcohol, or using drugs to feel better. How is this diagnosed? Stress is diagnosed through an assessment by your health care provider. He or she may diagnose this condition based on:  Your symptoms and any stressful life events.  Your medical history.  Tests to rule out other causes of your symptoms. Depending on your condition, your health care provider may refer you to a specialist for further evaluation. How is this treated?  Stress management techniques are the recommended treatment for stress. Medicine is not typically recommended for the treatment of stress. Techniques to reduce your reaction to stressful life events include:  Stress identification. Monitor yourself for symptoms of stress and identify what causes stress for you. These skills may help you to avoid or prepare for stressful events.  Time management. Set your priorities, keep a calendar of events, and learn to say no. Taking these actions can help you avoid making too many commitments. Techniques for coping with stress include:  Rethinking the problem. Try to think realistically about stressful events rather than ignoring them or overreacting. Try to find the positives in a stressful situation rather than focusing on the negatives.  Exercise. Physical exercise can release both physical and emotional tension. The key is to find a form of exercise that you enjoy and do it regularly.  Relaxation techniques. These relax the body and mind. The key is to find one or more that you enjoy and use the techniques regularly. Examples include: ? Meditation, deep breathing, or progressive relaxation techniques. ? Yoga or   tai chi. ? Biofeedback, mindfulness techniques, or journaling. ? Listening to music, being out in nature, or participating in other hobbies.  Practicing a healthy lifestyle. Eat a balanced diet, drink plenty of water, limit or avoid caffeine, and get plenty of  sleep.  Having a strong support network. Spend time with family, friends, or other people you enjoy being around. Express your feelings and talk things over with someone you trust. Counseling or talk therapy with a mental health professional may be helpful if you are having trouble managing stress on your own. Follow these instructions at home: Lifestyle   Avoid drugs.  Do not use any products that contain nicotine or tobacco, such as cigarettes, e-cigarettes, and chewing tobacco. If you need help quitting, ask your health care provider.  Limit alcohol intake to no more than 1 drink a day for nonpregnant women and 2 drinks a day for men. One drink equals 12 oz of beer, 5 oz of wine, or 1 oz of hard liquor  Do not use alcohol or drugs to relax.  Eat a balanced diet that includes fresh fruits and vegetables, whole grains, lean meats, fish, eggs, and beans, and low-fat dairy. Avoid processed foods and foods high in added fat, sugar, and salt.  Exercise at least 30 minutes on 5 or more days each week.  Get 7-8 hours of sleep each night. General instructions   Practice stress management techniques as discussed with your health care provider.  Drink enough fluid to keep your urine clear or pale yellow.  Take over-the-counter and prescription medicines only as told by your health care provider.  Keep all follow-up visits as told by your health care provider. This is important. Contact a health care provider if:  Your symptoms get worse.  You have new symptoms.  You feel overwhelmed by your problems and can no longer manage them on your own. Get help right away if:  You have thoughts of hurting yourself or others. If you ever feel like you may hurt yourself or others, or have thoughts about taking your own life, get help right away. You can go to your nearest emergency department or call:  Your local emergency services (911 in the [place]  A suicide crisis helpline, such as the  [place] at (316) 250-6172. This is open 24 hours a day. Summary  Stress is a normal reaction to life events. It can cause problems if it happens too often or for too long.  Practicing stress management techniques is the best way to treat stress.  Counseling or talk therapy with a mental health professional may be helpful if you are having trouble managing stress on your own. This information is not intended to replace advice given to you by your health care provider. Make sure you discuss any questions you have with your health care provider. Document Revised: 05/18/2019 Document Reviewed: 12/08/2016 Elsevier Patient Education  [place].
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
## 2020-12-08 ENCOUNTER — Other Ambulatory Visit: Payer: Self-pay | Admitting: *Deleted

## 2020-12-08 DIAGNOSIS — J301 Allergic rhinitis due to pollen: Secondary | ICD-10-CM

## 2020-12-08 NOTE — Telephone Encounter (Signed)
[facility] fax request from [place] to refill Fluticason LMOVM to patient to make sure she requested refill to this local pharmacy, I know that she moved to [place], [place]

## 2020-12-10 ENCOUNTER — Encounter: Payer: Self-pay | Admitting: Gastroenterology

## 2020-12-10 MED ORDER — FLUTICASONE PROPIONATE 50 MCG/ACT NA SUSP: 2.0000 | Freq: Every day | NASAL | 1 refills | Status: DC

## 2020-12-10 NOTE — Telephone Encounter (Signed)
Pt is ok with refill going to the [facility], refill sent

## 2021-04-14 ENCOUNTER — Other Ambulatory Visit: Payer: Self-pay | Admitting: *Deleted

## 2021-04-14 DIAGNOSIS — F3342 Major depressive disorder, recurrent, in full remission: Secondary | ICD-10-CM

## 2021-04-16 ENCOUNTER — Other Ambulatory Visit: Payer: Self-pay | Admitting: *Deleted

## 2021-04-16 DIAGNOSIS — F3342 Major depressive disorder, recurrent, in full remission: Secondary | ICD-10-CM

## 2021-04-16 MED ORDER — CITALOPRAM HYDROBROMIDE 40 MG PO TABS: 40.0000 mg | ORAL_TABLET | Freq: Every day | ORAL | 1 refills | Status: DC

## 2021-05-21 ENCOUNTER — Telehealth: Payer: Self-pay | Admitting: Nurse Practitioner

## 2021-05-21 DIAGNOSIS — R000 Tachycardia, unspecified: Secondary | ICD-10-CM

## 2021-05-21 NOTE — Telephone Encounter (Signed)
LMOVM, needing to find out if pt has found a provider in [place] or is she going to continue to see [facility]MM, if she is going to continue to be seen here it is time for her 6 mos ckup

## 2021-05-21 NOTE — Telephone Encounter (Signed)
  Prescription Request  05/21/2021  What is the name of the medication or equipment? metoprolol  Have you contacted your pharmacy to request a refill? (if applicable) no  Which pharmacy would you like this sent to? [facility] in [place]. [phone]   Patient notified that their request is being sent to the clinical staff for review and that they should receive a response within 2 business days.

## 2021-05-26 MED ORDER — METOPROLOL SUCCINATE ER 50 MG PO TB24: ORAL_TABLET | ORAL | 1 refills | Status: DC

## 2021-05-26 NOTE — Telephone Encounter (Signed)
Pt made appt today with [facility]MM for 05/28/21, refill sent to pharmacy

## 2021-05-28 ENCOUNTER — Ambulatory Visit: Payer: BC Managed Care – PPO | Admitting: Nurse Practitioner

## 2021-06-04 ENCOUNTER — Encounter: Payer: Self-pay | Admitting: Nurse Practitioner

## 2021-06-10 ENCOUNTER — Ambulatory Visit: Payer: BC Managed Care – PPO | Admitting: Nurse Practitioner

## 2021-06-10 ENCOUNTER — Other Ambulatory Visit: Payer: Self-pay

## 2021-06-10 ENCOUNTER — Encounter: Payer: Self-pay | Admitting: Nurse Practitioner

## 2021-06-10 VITALS — BP 156/88 | HR 53 | Temp 97.5°F | Resp 20 | Ht 64.0 in | Wt 181.0 lb

## 2021-06-10 DIAGNOSIS — F411 Generalized anxiety disorder: Secondary | ICD-10-CM | POA: Diagnosis not present

## 2021-06-10 DIAGNOSIS — E782 Mixed hyperlipidemia: Secondary | ICD-10-CM

## 2021-06-10 DIAGNOSIS — Z23 Encounter for immunization: Secondary | ICD-10-CM | POA: Diagnosis not present

## 2021-06-10 DIAGNOSIS — M797 Fibromyalgia: Secondary | ICD-10-CM

## 2021-06-10 DIAGNOSIS — F3342 Major depressive disorder, recurrent, in full remission: Secondary | ICD-10-CM | POA: Diagnosis not present

## 2021-06-10 DIAGNOSIS — R000 Tachycardia, unspecified: Secondary | ICD-10-CM

## 2021-06-10 DIAGNOSIS — F5101 Primary insomnia: Secondary | ICD-10-CM

## 2021-06-10 MED ORDER — METOPROLOL SUCCINATE ER 50 MG PO TB24
ORAL_TABLET | ORAL | 1 refills | Status: DC
Start: 2021-06-10 — End: 2022-01-26

## 2021-06-10 MED ORDER — CITALOPRAM HYDROBROMIDE 40 MG PO TABS: 40.0000 mg | ORAL_TABLET | Freq: Every day | ORAL | 1 refills | Status: DC

## 2021-06-10 NOTE — Patient Instructions (Signed)
Textbook of family medicine (9th ed., pp. 1062-1073). [place], [place]: [employer].">  Stress, Adult Stress is a normal reaction to life events. Stress is what you feel when life demands more than you are used to, or more than you think you can handle. Some stress can be useful, such as studying for a test or meeting a deadline at work. Stress that occurs too often or for too long can cause problems. It can affect your emotional health and interfere with relationships and normal daily activities. Too much stress can weaken your body's defense system (immune system) and increase your risk for physical illness. If you already have a medicalproblem, stress can make it worse. What are the causes? All sorts of life events can cause stress. An event that causes stress for one person may not be stressful for another person. Major life events, whether positive or negative, commonly cause stress. Examples include: Losing a job or starting a new job. Losing a loved one. Moving to a new town or home. Getting married or divorced. Having a baby. Getting injured or sick. Less obvious life events can also cause stress, especially if they occur day after day or in combination with each other. Examples include: Working long hours. Driving in traffic. Caring for children. Being in debt. Being in a difficult relationship. What are the signs or symptoms? Stress can cause emotional symptoms, including: Anxiety. This is feeling worried, afraid, on edge, overwhelmed, or out of control. Anger, including irritation or impatience. Depression. This is feeling sad, down, helpless, or guilty. Trouble focusing, remembering, or making decisions. Stress can cause physical symptoms, including: Aches and pains. These may affect your head, neck, back, stomach, or other areas of your body. Tight muscles or a clenched jaw. Low energy. Trouble sleeping. Stress can cause unhealthy behaviors, including: Eating to feel better  (overeating) or skipping meals. Working too much or putting off tasks. Smoking, drinking alcohol, or using drugs to feel better. How is this diagnosed? Stress is diagnosed through an assessment by your health care provider. He or she may diagnose this condition based on: Your symptoms and any stressful life events. Your medical history. Tests to rule out other causes of your symptoms. Depending on your condition, your health care provider may refer you to aspecialist for further evaluation. How is this treated?  Stress management techniques are the recommended treatment for stress. Medicineis not typically recommended for the treatment of stress. Techniques to reduce your reaction to stressful life events include: Stress identification. Monitor yourself for symptoms of stress and identify what causes stress for you. These skills may help you to avoid or prepare for stressful events. Time management. Set your priorities, keep a calendar of events, and learn to say no. Taking these actions can help you avoid making too many commitments. Techniques for coping with stress include: Rethinking the problem. Try to think realistically about stressful events rather than ignoring them or overreacting. Try to find the positives in a stressful situation rather than focusing on the negatives. Exercise. Physical exercise can release both physical and emotional tension. The key is to find a form of exercise that you enjoy and do it regularly. Relaxation techniques. These relax the body and mind. The key is to find one or more that you enjoy and use the techniques regularly. Examples include: Meditation, deep breathing, or progressive relaxation techniques. Yoga or tai chi. Biofeedback, mindfulness techniques, or journaling. Listening to music, being out in nature, or participating in other hobbies. Practicing a healthy lifestyle.   Eat a balanced diet, drink plenty of water, limit or avoid caffeine, and get  plenty of sleep. Having a strong support network. Spend time with family, friends, or other people you enjoy being around. Express your feelings and talk things over with someone you trust. Counseling or talk therapy with a mental health professional may be helpful if you are havingtrouble managing stress on your own. Follow these instructions at home: Lifestyle  Avoid drugs. Do not use any products that contain nicotine or tobacco, such as cigarettes, e-cigarettes, and chewing tobacco. If you need help quitting, ask your health care provider. Limit alcohol intake to no more than 1 drink a day for nonpregnant women and 2 drinks a day for men. One drink equals 12 oz of beer, 5 oz of wine, or 1 oz of hard liquor Do not use alcohol or drugs to relax. Eat a balanced diet that includes fresh fruits and vegetables, whole grains, lean meats, fish, eggs, and beans, and low-fat dairy. Avoid processed foods and foods high in added fat, sugar, and salt. Exercise at least 30 minutes on 5 or more days each week. Get 7-8 hours of sleep each night.  General instructions  Practice stress management techniques as discussed with your health care provider. Drink enough fluid to keep your urine clear or pale yellow. Take over-the-counter and prescription medicines only as told by your health care provider. Keep all follow-up visits as told by your health care provider. This is important.  Contact a health care provider if: Your symptoms get worse. You have new symptoms. You feel overwhelmed by your problems and can no longer manage them on your own. Get help right away if: You have thoughts of hurting yourself or others. If you ever feel like you may hurt yourself or others, or have thoughts about taking your own life, get help right away. You can go to your nearest emergency department or call: Your local emergency services (911 in the [place] A suicide crisis helpline, such as the [facility] at 4253100524. This is open 24 hours a day. Summary Stress is a normal reaction to life events. It can cause problems if it happens too often or for too long. Practicing stress management techniques is the best way to treat stress. Counseling or talk therapy with a mental health professional may be helpful if you are having trouble managing stress on your own. This information is not intended to replace advice given to you by your health care provider. Make sure you discuss any questions you have with your healthcare provider. Document Revised: 07/04/2020 Document Reviewed: 07/04/2020 Elsevier Patient Education  2022 [employer].
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
## 2021-06-10 NOTE — Addendum Note (Signed)
Addended by: Rolena Infante on: 06/10/2021 01:23 PM   Modules accepted: Orders

## 2021-07-02 ENCOUNTER — Other Ambulatory Visit: Payer: Self-pay | Admitting: Nurse Practitioner

## 2021-07-02 DIAGNOSIS — J301 Allergic rhinitis due to pollen: Secondary | ICD-10-CM

## 2021-09-22 ENCOUNTER — Emergency Department (HOSPITAL_COMMUNITY)
Admission: EM | Admit: 2021-09-22 | Discharge: 2021-09-22 | Disposition: A | Discharge status: Home / Self Care | Payer: BC Managed Care – PPO | Attending: Emergency Medicine | Admitting: Emergency Medicine

## 2021-09-22 ENCOUNTER — Other Ambulatory Visit: Payer: Self-pay

## 2021-09-22 ENCOUNTER — Encounter (HOSPITAL_COMMUNITY): Payer: Self-pay | Admitting: *Deleted

## 2021-09-22 DIAGNOSIS — W5321XA Bitten by squirrel, initial encounter: Secondary | ICD-10-CM | POA: Diagnosis not present

## 2021-09-22 DIAGNOSIS — Z23 Encounter for immunization: Secondary | ICD-10-CM | POA: Insufficient documentation

## 2021-09-22 DIAGNOSIS — Z79899 Other long term (current) drug therapy: Secondary | ICD-10-CM | POA: Diagnosis not present

## 2021-09-22 DIAGNOSIS — S61230A Puncture wound without foreign body of right index finger without damage to nail, initial encounter: Secondary | ICD-10-CM | POA: Diagnosis not present

## 2021-09-22 DIAGNOSIS — I10 Essential (primary) hypertension: Secondary | ICD-10-CM | POA: Insufficient documentation

## 2021-09-22 DIAGNOSIS — S6991XA Unspecified injury of right wrist, hand and finger(s), initial encounter: Secondary | ICD-10-CM | POA: Diagnosis present

## 2021-09-22 DIAGNOSIS — T148XXA Other injury of unspecified body region, initial encounter: Secondary | ICD-10-CM

## 2021-09-22 MED ORDER — TETANUS-DIPHTH-ACELL PERTUSSIS 5-2.5-18.5 LF-MCG/0.5 IM SUSY
0.5000 mL | PREFILLED_SYRINGE | Freq: Once | INTRAMUSCULAR | Status: AC
  Administered 2021-09-22: 0.5 mL via INTRAMUSCULAR
  Filled 2021-09-22: qty 0.5

## 2021-09-22 MED ORDER — AMOXICILLIN-POT CLAVULANATE 875-125 MG PO TABS: 1.0000 | ORAL_TABLET | Freq: Two times a day (BID) | ORAL | 0 refills | Status: AC

## 2021-09-22 NOTE — Discharge Instructions (Addendum)
The CDC does not recommend rabies vaccine for squirrel bites.  Take antibiotic as directed.  Return if increased redness or swelling that looks like infection

## 2021-09-22 NOTE — ED Notes (Signed)
Dc instructions and scripts reviewed with pt no questions or concerns at this time.  

## 2021-09-22 NOTE — ED Triage Notes (Signed)
Pt bit by a squirrel to right index finger with a broken leg.  Pt threw out some food scraps and noted a squirrel and pulled his tail, the squirrel then bit the pt's finger.  Last tetanus shot is unknown.

## 2021-09-22 NOTE — ED Notes (Signed)
Dc instructions and scripts reviewed with pt. No questions or concerns at this time.  
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
## 2021-10-12 ENCOUNTER — Other Ambulatory Visit: Payer: Self-pay | Admitting: Nurse Practitioner

## 2021-10-12 DIAGNOSIS — F3342 Major depressive disorder, recurrent, in full remission: Secondary | ICD-10-CM

## 2021-10-29 ENCOUNTER — Other Ambulatory Visit: Payer: Self-pay | Admitting: Nurse Practitioner

## 2021-10-29 DIAGNOSIS — M797 Fibromyalgia: Secondary | ICD-10-CM

## 2021-12-01 ENCOUNTER — Other Ambulatory Visit: Payer: Self-pay | Admitting: *Deleted

## 2021-12-01 DIAGNOSIS — M797 Fibromyalgia: Secondary | ICD-10-CM

## 2021-12-01 MED ORDER — GABAPENTIN 100 MG PO CAPS: ORAL_CAPSULE | ORAL | 0 refills | Status: DC

## 2022-01-08 ENCOUNTER — Other Ambulatory Visit: Payer: Self-pay | Admitting: Nurse Practitioner

## 2022-01-08 DIAGNOSIS — F3342 Major depressive disorder, recurrent, in full remission: Secondary | ICD-10-CM

## 2022-01-08 NOTE — Telephone Encounter (Signed)
MMM NTBS for 6 mos ckup. Refill sent to pharmacy ?

## 2022-01-08 NOTE — Telephone Encounter (Signed)
Appt made for 3/28 ?

## 2022-01-19 ENCOUNTER — Other Ambulatory Visit: Payer: Self-pay | Admitting: Nurse Practitioner

## 2022-01-19 DIAGNOSIS — M797 Fibromyalgia: Secondary | ICD-10-CM

## 2022-01-26 ENCOUNTER — Telehealth: Payer: Self-pay | Admitting: Nurse Practitioner

## 2022-01-26 ENCOUNTER — Ambulatory Visit: Payer: BC Managed Care – PPO | Admitting: Nurse Practitioner

## 2022-01-26 ENCOUNTER — Encounter: Payer: Self-pay | Admitting: Nurse Practitioner

## 2022-01-26 VITALS — BP 113/76 | HR 65 | Temp 97.6°F | Ht 64.0 in | Wt 183.8 lb

## 2022-01-26 DIAGNOSIS — R000 Tachycardia, unspecified: Secondary | ICD-10-CM

## 2022-01-26 DIAGNOSIS — Z1212 Encounter for screening for malignant neoplasm of rectum: Secondary | ICD-10-CM

## 2022-01-26 DIAGNOSIS — E782 Mixed hyperlipidemia: Secondary | ICD-10-CM

## 2022-01-26 DIAGNOSIS — Z23 Encounter for immunization: Secondary | ICD-10-CM

## 2022-01-26 DIAGNOSIS — M797 Fibromyalgia: Secondary | ICD-10-CM

## 2022-01-26 DIAGNOSIS — Z1211 Encounter for screening for malignant neoplasm of colon: Secondary | ICD-10-CM

## 2022-01-26 DIAGNOSIS — F411 Generalized anxiety disorder: Secondary | ICD-10-CM

## 2022-01-26 DIAGNOSIS — F3342 Major depressive disorder, recurrent, in full remission: Secondary | ICD-10-CM

## 2022-01-26 DIAGNOSIS — F5101 Primary insomnia: Secondary | ICD-10-CM

## 2022-01-26 MED ORDER — WEGOVY 0.25 MG/0.5ML ~~LOC~~ SOAJ: 0.2500 mg | SUBCUTANEOUS | 0 refills | Status: DC

## 2022-01-26 MED ORDER — METOPROLOL SUCCINATE ER 50 MG PO TB24: ORAL_TABLET | ORAL | 1 refills | Status: DC

## 2022-01-26 MED ORDER — GABAPENTIN 100 MG PO CAPS: 100.0000 mg | ORAL_CAPSULE | Freq: Three times a day (TID) | ORAL | 1 refills | Status: DC

## 2022-01-26 MED ORDER — CITALOPRAM HYDROBROMIDE 40 MG PO TABS: 40.0000 mg | ORAL_TABLET | Freq: Every day | ORAL | 1 refills | Status: DC

## 2022-01-26 NOTE — Telephone Encounter (Signed)
[facility] prescription sen to pharmacy we will do prior auth and then you can get it. We will increase dose monthly as needed. ?

## 2022-01-26 NOTE — Telephone Encounter (Signed)
Pt wanted to let MMM that she called her insurance company and they do accept [facility] Rx's with a PA that will need to be sent to [facility] for approval. ?
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
## 2022-01-27 ENCOUNTER — Telehealth: Payer: Self-pay

## 2022-01-27 LAB — CMP14+EGFR
ALT: 26 IU/L (ref 0–32)
AST: 26 IU/L (ref 0–40)
Albumin/Globulin Ratio: 2 (ref 1.2–2.2)
Albumin: 4.5 g/dL (ref 3.8–4.8)
Alkaline Phosphatase: 68 IU/L (ref 44–121)
BUN/Creatinine Ratio: 15 (ref 12–28)
BUN: 11 mg/dL (ref 8–27)
Bilirubin Total: 0.4 mg/dL (ref 0.0–1.2)
CO2: 25 mmol/L (ref 20–29)
Calcium: 10 mg/dL (ref 8.7–10.3)
Chloride: 99 mmol/L (ref 96–106)
Creatinine, Ser: 0.72 mg/dL (ref 0.57–1.00)
Globulin, Total: 2.2 g/dL (ref 1.5–4.5)
Glucose: 84 mg/dL (ref 70–99)
Potassium: 4.1 mmol/L (ref 3.5–5.2)
Sodium: 138 mmol/L (ref 134–144)
Total Protein: 6.7 g/dL (ref 6.0–8.5)
eGFR: 95 mL/min/{1.73_m2} (ref >59)

## 2022-01-27 LAB — CBC WITH DIFFERENTIAL/PLATELET
Basophils Absolute: 0 10*3/uL (ref 0.0–0.2)
Basos: 1 %
EOS (ABSOLUTE): 0.1 10*3/uL (ref 0.0–0.4)
Eos: 2 %
Hematocrit: 43.8 % (ref 34.0–46.6)
Hemoglobin: 14.9 g/dL (ref 11.1–15.9)
Immature Grans (Abs): 0 10*3/uL (ref 0.0–0.1)
Immature Granulocytes: 0 %
Lymphocytes Absolute: 3 10*3/uL (ref 0.7–3.1)
Lymphs: 46 %
MCH: 31.6 pg (ref 26.6–33.0)
MCHC: 34 g/dL (ref 31.5–35.7)
MCV: 93 fL (ref 79–97)
Monocytes Absolute: 0.4 10*3/uL (ref 0.1–0.9)
Monocytes: 7 %
Neutrophils Absolute: 2.8 10*3/uL (ref 1.4–7.0)
Neutrophils: 44 %
Platelets: 223 10*3/uL (ref 150–450)
RBC: 4.71 x10E6/uL (ref 3.77–5.28)
RDW: 12.3 % (ref 11.7–15.4)
WBC: 6.3 10*3/uL (ref 3.4–10.8)

## 2022-01-27 LAB — LIPID PANEL
Chol/HDL Ratio: 5.1 ratio — ABNORMAL HIGH (ref 0.0–4.4)
Cholesterol, Total: 278 mg/dL — ABNORMAL HIGH (ref 100–199)
HDL: 55 mg/dL (ref >39)
LDL Chol Calc (NIH): 164 mg/dL — ABNORMAL HIGH (ref 0–99)
Triglycerides: 314 mg/dL — ABNORMAL HIGH (ref 0–149)
VLDL Cholesterol Cal: 59 mg/dL — ABNORMAL HIGH (ref 5–40)

## 2022-01-27 NOTE — Telephone Encounter (Signed)
Left detailed message on patients voicemail that rx was sent to pharmacy and we would look for PA paperwork and notify her when its done ?

## 2022-01-27 NOTE — Telephone Encounter (Signed)
Casey Drake  ?(Key: [id number]) ?Rx #: [id number] ?Wegovy 0.'25MG'$ /0.5ML auto-injectors ? ?  ?Form ?[employer] Electronic PA Form [id number] NCPDP) ? ?Sent to plan ?

## 2022-01-29 NOTE — Telephone Encounter (Signed)
[facility] (KeyAugust Albino) - [id number] ?Wegovy 0.'25MG'$ /0.5ML auto-injectors ?Status: PA Response - Approved ?Created: March 28th, 2023 [phone] ?

## 2022-02-04 NOTE — Addendum Note (Signed)
Addended by: Antonietta Barcelona D on: 02/04/2022 08:23 AM ? ? Modules accepted: Orders ? ?

## 2022-02-18 ENCOUNTER — Telehealth: Payer: Self-pay | Admitting: Nurse Practitioner

## 2022-02-18 MED ORDER — WEGOVY 0.5 MG/0.5ML ~~LOC~~ SOAJ: 0.5000 mg | SUBCUTANEOUS | 1 refills | Status: DC

## 2022-02-18 NOTE — Telephone Encounter (Signed)
What dose is she currently on?

## 2022-02-18 NOTE — Telephone Encounter (Signed)
Patient informed. 

## 2022-02-18 NOTE — Telephone Encounter (Signed)
Increase wegovy dose to .'5mg'$  weekly- new prescription sent to pharmacy ?

## 2022-02-18 NOTE — Telephone Encounter (Signed)
?  Prescription Request ? ?02/18/2022 ? ?Is this a "Controlled Substance" medicine? Semaglutide-Weight Management (WEGOVY) 0.25 MG/0.5ML SOAJ ? ?Have you seen your PCP in the last 2 weeks? 03.28.2023 ? ?If YES, route message to pool  -  If NO, patient needs to be scheduled for appointment. ? ?What is the name of the medication or equipment? Semaglutide-Weight Management (WEGOVY) 0.25 MG/0.5ML SOAJ ?Pt is asking for a higher dosage because she feels like she is not losing weight at 0.25. Pt has one shot left of 0.25 for tomorrow. ? ?Have you contacted your pharmacy to request a refill? no  ? ?Which pharmacy would you like this sent to? [facility] in [place] phone [phone] ? ? ?Patient notified that their request is being sent to the clinical staff for review and that they should receive a response within 2 business days.  ? ? ?

## 2022-03-17 ENCOUNTER — Encounter: Payer: Self-pay | Admitting: Nurse Practitioner

## 2022-03-31 ENCOUNTER — Encounter: Payer: Self-pay | Admitting: Gastroenterology

## 2022-04-15 NOTE — Progress Notes (Signed)
[id number]  

## 2022-04-19 ENCOUNTER — Telehealth: Payer: Self-pay | Admitting: Nurse Practitioner

## 2022-04-19 MED ORDER — WEGOVY 0.5 MG/0.5ML ~~LOC~~ SOAJ: 0.5000 mg | SUBCUTANEOUS | 1 refills | Status: AC

## 2022-04-19 NOTE — Telephone Encounter (Signed)
  Prescription Request  04/19/2022  Is this a "Controlled Substance" medicine? Not sure  Have you seen your PCP in the last 2 weeks? no  If YES, route message to pool  -  If NO, patient needs to be scheduled for appointment.  What is the name of the medication or equipment? UTMLYY  Have you contacted your pharmacy to request a refill? no   Which pharmacy would you like this sent to? [facility], [place]   Patient notified that their request is being sent to the clinical staff for review and that they should receive a response within 2 business days.   MMM's pt.  Pt has an appt in September.

## 2022-04-19 NOTE — Telephone Encounter (Signed)
Refill sent to pharmacy per patients request. Left detailed message on patients voicemail that it was sent

## 2022-04-21 ENCOUNTER — Telehealth: Payer: Self-pay | Admitting: Nurse Practitioner

## 2022-04-21 NOTE — Telephone Encounter (Signed)
Pt called to let MMM know that the 0.5 and 1.0 of Wegovy is on back order. Says she hasnt really been losing weight with the dosage anyway. Wants to know if MMM can send in 1.7 dose for her?

## 2022-04-21 NOTE — Telephone Encounter (Signed)
Patients typically need to be seen for 78-monthcheckup for this medicine anyways to keep it under insurance so have her be seen in and I can assess where she can go from there.

## 2022-04-21 NOTE — Telephone Encounter (Signed)
Martin patient, please advise.  Patient started out on 0.25 mg and increased to 0.5 mg after one month.  Started 0.25 on 01/26/22.

## 2022-04-22 NOTE — Telephone Encounter (Signed)
You cannot skip dosages .   May need to change  to daily injection of saxenda if cant get wegovy

## 2022-04-22 NOTE — Telephone Encounter (Signed)
Patient wants to try Saxenda. Will you send in to the pharmacy? Casey Drake in [place]

## 2022-04-22 NOTE — Telephone Encounter (Signed)
I spoke to Casey Drake and advised of provider feedback and she says she has an appt with MMM in September and really didn't want to have to come into the office. She asked if this message could be routed to [facility] and is aware she is on vacation till next week.

## 2022-04-23 ENCOUNTER — Telehealth: Payer: Self-pay

## 2022-04-23 MED ORDER — SAXENDA 18 MG/3ML ~~LOC~~ SOPN: 1.2000 mg | PEN_INJECTOR | Freq: Every day | SUBCUTANEOUS | 5 refills | Status: AC

## 2022-05-09 ENCOUNTER — Other Ambulatory Visit: Payer: Self-pay | Admitting: Nurse Practitioner

## 2022-05-09 DIAGNOSIS — M797 Fibromyalgia: Secondary | ICD-10-CM

## 2022-05-26 ENCOUNTER — Encounter: Payer: BC Managed Care – PPO | Admitting: Gastroenterology

## 2022-06-01 ENCOUNTER — Encounter: Payer: BC Managed Care – PPO | Admitting: Gastroenterology

## 2022-07-29 ENCOUNTER — Ambulatory Visit (INDEPENDENT_AMBULATORY_CARE_PROVIDER_SITE_OTHER): Payer: BLUE CROSS/BLUE SHIELD | Admitting: Nurse Practitioner

## 2022-07-29 ENCOUNTER — Encounter: Payer: Self-pay | Admitting: Nurse Practitioner

## 2022-07-29 VITALS — BP 142/84 | HR 60 | Temp 96.9°F | Ht 64.0 in | Wt 174.0 lb

## 2022-07-29 DIAGNOSIS — F3342 Major depressive disorder, recurrent, in full remission: Secondary | ICD-10-CM | POA: Diagnosis not present

## 2022-07-29 DIAGNOSIS — F411 Generalized anxiety disorder: Secondary | ICD-10-CM | POA: Diagnosis not present

## 2022-07-29 DIAGNOSIS — J069 Acute upper respiratory infection, unspecified: Secondary | ICD-10-CM | POA: Diagnosis not present

## 2022-07-29 DIAGNOSIS — M797 Fibromyalgia: Secondary | ICD-10-CM | POA: Diagnosis not present

## 2022-07-29 MED ORDER — AZITHROMYCIN 250 MG PO TABS: ORAL_TABLET | ORAL | 0 refills | Status: AC

## 2022-07-29 MED ORDER — ESCITALOPRAM OXALATE 20 MG PO TABS: 20.0000 mg | ORAL_TABLET | Freq: Every day | ORAL | 5 refills | Status: AC

## 2022-07-29 MED ORDER — GABAPENTIN 100 MG PO CAPS: 100.0000 mg | ORAL_CAPSULE | Freq: Three times a day (TID) | ORAL | 0 refills | Status: DC

## 2022-07-29 MED ORDER — BENZONATATE 100 MG PO CAPS: 100.0000 mg | ORAL_CAPSULE | Freq: Three times a day (TID) | ORAL | 0 refills | Status: AC | PRN
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
## 2022-07-29 NOTE — Progress Notes (Signed)
Subjective:    Patient ID: Casey Drake, female    DOB: 07-Jun-1961, 61 y.o.   MRN: [id number]   Chief Complaint: Cough and Nasal Congestion (X 1 week )   Pt seen today for cough and congestion that started on Thursday  Cough This is a new problem. The current episode started in the past 7 days. The problem has been unchanged. The problem occurs every few hours. The cough is Productive of sputum. Associated symptoms include chills, headaches, nasal congestion, postnasal drip and rhinorrhea. Pertinent negatives include no chest pain, ear pain, myalgias, rash, sore throat or shortness of breath. Nothing aggravates the symptoms. She has tried OTC cough suppressant (zyrtec) for the symptoms. The treatment provided mild relief. There is no history of asthma, COPD or emphysema.    Depression/gad- pharmacy gave her a dIfferent brand of celexa and she does not like it.    07/29/2022   10:20 AM 01/26/2022    2:58 PM 06/10/2021   12:35 PM  Depression screen PHQ 2/9  Decreased Interest 0 0 1  Down, Depressed, Hopeless 0 0 1  PHQ - 2 Score 0 0 2  Altered sleeping 0 0 2  Tired, decreased energy 0 0 0  Change in appetite 0 0 0  Feeling bad or failure about yourself  0 0 0  Trouble concentrating 0 0 0  Moving slowly or fidgety/restless 0 0 0  Suicidal thoughts 0 0 0  PHQ-9 Score 0 0 4  Difficult doing work/chores Not difficult at all  Not difficult at all      07/29/2022   10:20 AM 01/26/2022    2:58 PM 06/10/2021   12:35 PM 10/22/2020   10:21 AM  GAD 7 : Generalized Anxiety Score  Nervous, Anxious, on Edge 0 0 0 0  Control/stop worrying 0 0 2 0  Worry too much - different things 0 0 2 0  Trouble relaxing 0 0 0 0  Restless 0 0 0 0  Easily annoyed or irritable 0 0 0 1  Afraid - awful might happen 0 0 0 0  Total GAD 7 Score 0 0 4 1  Anxiety Difficulty Not difficult at all  Not difficult at all Not difficult at all       Review of Systems  Constitutional:  Positive for chills and  fatigue.  HENT:  Positive for congestion, postnasal drip, rhinorrhea and sinus pressure. Negative for ear pain and sore throat.   Eyes:  Negative for pain.  Respiratory:  Positive for cough. Negative for shortness of breath.   Cardiovascular:  Negative for chest pain.  Gastrointestinal:  Negative for abdominal pain, diarrhea, nausea and vomiting.  Endocrine: Negative for polydipsia.  Musculoskeletal:  Negative for arthralgias and myalgias.  Skin:  Negative for rash.  Neurological:  Positive for headaches. Negative for weakness.  Hematological:  Does not bruise/bleed easily.  All other systems reviewed and are negative.      Objective:   Physical Exam Vitals and nursing note reviewed.  Constitutional:      General: She is not in acute distress.    Appearance: Normal appearance. She is well-developed.  HENT:     Head: Normocephalic and atraumatic.     Right Ear: Tympanic membrane and ear canal normal.     Left Ear: Tympanic membrane and ear canal normal.     Nose: Congestion present.     Right Sinus: Maxillary sinus tenderness present. No frontal sinus tenderness.     Left Sinus:  Maxillary sinus tenderness present. No frontal sinus tenderness.     Mouth/Throat:     Mouth: Mucous membranes are moist.     Pharynx: Posterior oropharyngeal erythema present.  Eyes:     Pupils: Pupils are equal, round, and reactive to light.  Cardiovascular:     Rate and Rhythm: Normal rate and regular rhythm.     Heart sounds: Normal heart sounds, S1 normal and S2 normal.  Pulmonary:     Effort: Pulmonary effort is normal.     Breath sounds: Normal breath sounds. No wheezing, rhonchi or rales.  Chest:     Chest wall: No tenderness.  Abdominal:     General: Bowel sounds are normal. There is no distension.     Palpations: Abdomen is soft. There is no hepatomegaly or splenomegaly.     Tenderness: There is no abdominal tenderness.  Musculoskeletal:     Cervical back: Normal range of motion and neck  supple.  Lymphadenopathy:     Cervical: No cervical adenopathy.  Skin:    General: Skin is warm.     Capillary Refill: Capillary refill takes less than 2 seconds.     Findings: No rash.  Neurological:     Mental Status: She is alert and oriented to person, place, and time.  Psychiatric:        Attention and Perception: Attention normal.        Mood and Affect: Mood and affect normal.        Behavior: Behavior normal.        Thought Content: Thought content normal.   BP (!) 172/89   Pulse 60   Temp (!) 96.9 F (36.1 C) (Temporal)   Ht '5\' 4"'$  (1.626 m)   Wt 174 lb (78.9 kg)   LMP 03/11/2014   SpO2 92%   BMI 29.87 kg/m          Assessment & Plan:   Casey Drake in today with chief complaint of Cough and Nasal Congestion (X 1 week )   1. Recurrent major depressive disorder, in full remission ([place]) CHANGED CELEXA TO LEXAPRO  - escitalopram (LEXAPRO) 20 MG tablet; Take 1 tablet (20 mg total) by mouth daily.  Dispense: 30 tablet; Refill: 5  2. GAD (generalized anxiety disorder) Stress management  3. URI with cough and congestion 1. Take meds as prescribed 2. Use a cool mist humidifier especially during the winter months and when heat has been humid. 3. Use saline nose sprays frequently 4. Saline irrigations of the nose can be very helpful if done frequently.  * 4X daily for 1 week*  * Use of a nettie pot can be helpful with this. Follow directions with this* 5. Drink plenty of fluids 6. Keep thermostat turn down low 7.For any cough or congestion- tessalon perles 8. For fever or aces or pains- take tylenol or ibuprofen appropriate for age and weight.  * for fevers greater than 101 orally you may alternate ibuprofen and tylenol every  3 hours.    - azithromycin (ZITHROMAX Z-PAK) 250 MG tablet; As directed  Dispense: 6 tablet; Refill: 0 - benzonatate (TESSALON PERLES) 100 MG capsule; Take 1 capsule (100 mg total) by mouth 3 (three) times daily as needed for cough.   Dispense: 20 capsule; Refill: 0  4. Fibromyalgia Refilled gabapentin - gabapentin (NEURONTIN) 100 MG capsule; Take 1 capsule (100 mg total) by mouth 3 (three) times daily.  Dispense: 90 capsule; Refill: 0    The above assessment and management plan  was discussed with the patient. The patient verbalized understanding of and has agreed to the management plan. Patient is aware to call the clinic if symptoms persist or worsen. Patient is aware when to return to the clinic for a follow-up visit. Patient educated on when it is appropriate to go to the emergency department.   [name] Hassell Done, FNP

## 2022-07-29 NOTE — Patient Instructions (Signed)

## 2022-08-17 ENCOUNTER — Other Ambulatory Visit: Payer: Self-pay | Admitting: Nurse Practitioner

## 2022-08-17 DIAGNOSIS — R000 Tachycardia, unspecified: Secondary | ICD-10-CM

## 2022-08-24 ENCOUNTER — Other Ambulatory Visit: Payer: Self-pay | Admitting: Nurse Practitioner

## 2022-08-24 ENCOUNTER — Telehealth: Payer: Self-pay | Admitting: *Deleted

## 2022-08-24 DIAGNOSIS — M797 Fibromyalgia: Secondary | ICD-10-CM

## 2022-08-24 NOTE — Telephone Encounter (Signed)
"  Your PA request cannot be processed for the member plan submitted. For further inquiries please contact the number on the back of the member prescription card."  Gonna see if there is another way.

## 2022-10-15 ENCOUNTER — Other Ambulatory Visit: Payer: Self-pay | Admitting: Nurse Practitioner

## 2022-10-15 DIAGNOSIS — F3342 Major depressive disorder, recurrent, in full remission: Secondary | ICD-10-CM

## 2022-12-28 ENCOUNTER — Encounter: Payer: Self-pay | Admitting: Nurse Practitioner

## 2023-02-09 ENCOUNTER — Other Ambulatory Visit: Payer: Self-pay | Admitting: Nurse Practitioner

## 2023-02-09 DIAGNOSIS — R000 Tachycardia, unspecified: Secondary | ICD-10-CM

## 2023-03-07 ENCOUNTER — Other Ambulatory Visit: Payer: Self-pay | Admitting: Nurse Practitioner

## 2023-03-07 DIAGNOSIS — R000 Tachycardia, unspecified: Secondary | ICD-10-CM

## 2023-03-21 ENCOUNTER — Other Ambulatory Visit: Payer: Self-pay | Admitting: Nurse Practitioner

## 2023-03-21 DIAGNOSIS — R000 Tachycardia, unspecified: Secondary | ICD-10-CM

## 2023-03-21 NOTE — Telephone Encounter (Signed)
MMM pt NTBS 30-d given 02/09/23

## 2023-03-21 NOTE — Telephone Encounter (Signed)
Pt changed drs and lives in [place]

## 2023-04-17 ENCOUNTER — Other Ambulatory Visit: Payer: Self-pay | Admitting: Nurse Practitioner

## 2023-04-17 DIAGNOSIS — F3342 Major depressive disorder, recurrent, in full remission: Secondary | ICD-10-CM

## 2023-04-20 ENCOUNTER — Other Ambulatory Visit: Payer: Self-pay | Admitting: Nurse Practitioner

## 2023-04-20 DIAGNOSIS — F3342 Major depressive disorder, recurrent, in full remission: Secondary | ICD-10-CM

## 2023-04-21 ENCOUNTER — Other Ambulatory Visit: Payer: Self-pay | Admitting: Nurse Practitioner

## 2023-04-21 DIAGNOSIS — F3342 Major depressive disorder, recurrent, in full remission: Secondary | ICD-10-CM

## 2023-06-27 ENCOUNTER — Other Ambulatory Visit (HOSPITAL_COMMUNITY): Payer: Self-pay | Admitting: Orthopedic Surgery

## 2023-07-21 ENCOUNTER — Ambulatory Visit (HOSPITAL_BASED_OUTPATIENT_CLINIC_OR_DEPARTMENT_OTHER): Admit: 2023-07-21 | Payer: BC Managed Care – PPO | Admitting: Orthopedic Surgery

## 2023-07-21 ENCOUNTER — Encounter (HOSPITAL_BASED_OUTPATIENT_CLINIC_OR_DEPARTMENT_OTHER): Payer: Self-pay

## 2023-07-21 SURGERY — GASTROCNEMIUS SLIDE
Anesthesia: General | Site: Ankle | Laterality: Right
